# Patient Record
Sex: Female | Born: 2000 | Race: Black or African American | Hispanic: No | Marital: Single | Smoking: Never smoker
Health system: Southern US, Community
[De-identification: ages and names within clinical notes are randomized; demographics above are authoritative.]

## PROBLEM LIST (undated history)

## (undated) ENCOUNTER — Ambulatory Visit (HOSPITAL_COMMUNITY): Discharge status: Absent without leave | Payer: 59

## (undated) DIAGNOSIS — J45909 Unspecified asthma, uncomplicated: Secondary | ICD-10-CM

---
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
## 2019-05-21 ENCOUNTER — Ambulatory Visit: Payer: 59 | Attending: Family

## 2019-05-21 DIAGNOSIS — Z23 Encounter for immunization: Secondary | ICD-10-CM

## 2019-05-21 NOTE — Progress Notes (Signed)
   Covid-19 Vaccination Clinic  Name:  Zaniah Titterington    MRN: [id number] DOB: 08-04-2000  05/21/2019  Ms. Odle was observed post Covid-19 immunization for 15 minutes without incident. She was provided with Vaccine Information Sheet and instruction to access the V-Safe system.   Ms. Kiser was instructed to call 911 with any severe reactions post vaccine: Marland Kitchen Difficulty breathing  . Swelling of face and throat  . A fast heartbeat  . A bad rash all over body  . Dizziness and weakness   Immunizations Administered    Name Date Dose VIS Date Route   Moderna COVID-19 Vaccine 05/21/2019 12:11 PM 0.5 mL 12/2018 Intramuscular   Manufacturer: Moderna   Lot: [id number]   NDC: 67124-580-99

## 2019-05-24 ENCOUNTER — Ambulatory Visit: Payer: 59

## 2019-06-25 ENCOUNTER — Ambulatory Visit: Payer: 59 | Attending: Family

## 2019-06-25 DIAGNOSIS — Z23 Encounter for immunization: Secondary | ICD-10-CM

## 2019-06-25 NOTE — Progress Notes (Signed)
   Covid-19 Vaccination Clinic  Name:  Ashley Petersen    MRN: [id number] DOB: [birth year]  06/25/2019  [name] was observed post Covid-19 immunization for 15 minutes without incident. She was provided with Vaccine Information Sheet and instruction to access the V-Safe system.   [name] was instructed to call 911 with any severe reactions post vaccine: Marland Kitchen Difficulty breathing  . Swelling of face and throat  . A fast heartbeat  . A bad rash all over body  . Dizziness and weakness   Immunizations Administered    Name Date Dose VIS Date Route   Moderna COVID-19 Vaccine 06/25/2019 12:34 PM 0.5 mL 12/2018 Intramuscular   Manufacturer: Gala Murdoch   Lot: [id number]   NDC: 24497-530-05

## 2019-12-27 ENCOUNTER — Emergency Department: Payer: 59

## 2019-12-27 ENCOUNTER — Encounter: Payer: Self-pay | Admitting: Emergency Medicine

## 2019-12-27 ENCOUNTER — Emergency Department
Admission: EM | Admit: 2019-12-27 | Discharge: 2019-12-27 | Disposition: A | Discharge status: Home / Self Care | Payer: 59 | Attending: Emergency Medicine | Admitting: Emergency Medicine

## 2019-12-27 ENCOUNTER — Other Ambulatory Visit: Payer: Self-pay

## 2019-12-27 DIAGNOSIS — J45909 Unspecified asthma, uncomplicated: Secondary | ICD-10-CM | POA: Insufficient documentation

## 2019-12-27 DIAGNOSIS — R0789 Other chest pain: Secondary | ICD-10-CM | POA: Diagnosis present

## 2019-12-27 LAB — CBC
HCT: 35.7 % — ABNORMAL LOW (ref 36.0–46.0)
Hemoglobin: 11.2 g/dL — ABNORMAL LOW (ref 12.0–15.0)
MCH: 21.2 pg — ABNORMAL LOW (ref 26.0–34.0)
MCHC: 31.4 g/dL (ref 30.0–36.0)
MCV: 67.6 fL — ABNORMAL LOW (ref 80.0–100.0)
Platelets: 303 10*3/uL (ref 150–400)
RBC: 5.28 MIL/uL — ABNORMAL HIGH (ref 3.87–5.11)
RDW: 17.6 % — ABNORMAL HIGH (ref 11.5–15.5)
WBC: 4.6 10*3/uL (ref 4.0–10.5)
nRBC: 0 % (ref 0.0–0.2)

## 2019-12-27 LAB — BASIC METABOLIC PANEL
Anion gap: 11 (ref 5–15)
BUN: 12 mg/dL (ref 6–20)
CO2: 24 mmol/L (ref 22–32)
Calcium: 9.5 mg/dL (ref 8.9–10.3)
Chloride: 105 mmol/L (ref 98–111)
Creatinine, Ser: 0.53 mg/dL (ref 0.44–1.00)
GFR, Estimated: >60 mL/min (ref >60)
Glucose, Bld: 89 mg/dL (ref 70–99)
Potassium: 3.6 mmol/L (ref 3.5–5.1)
Sodium: 140 mmol/L (ref 135–145)

## 2019-12-27 LAB — TROPONIN I (HIGH SENSITIVITY): Troponin I (High Sensitivity): <2 ng/L (ref <18)

## 2019-12-27 LAB — FIBRIN DERIVATIVES D-DIMER (ARMC ONLY): Fibrin derivatives D-dimer (ARMC): 234.83 ng/mL (FEU) (ref 0.00–499.00)

## 2019-12-27 LAB — POC URINE PREG, ED: Preg Test, Ur: NEGATIVE

## 2019-12-27 NOTE — Discharge Instructions (Addendum)
Your EKG, chest x-ray, and lab tests were all normal today.  Take ibuprofen as needed for pain and follow-up with your doctor next week if the symptoms have not resolved completely.

## 2019-12-27 NOTE — ED Triage Notes (Signed)
Pt to ED via POV with c/o intermittent L sided CP, intermittent SOB and intermittent dizziness. Pt also c/o intermittent heart palpitations. Pt states had covid booster on Friday. Pt A&O x4, NAD noted in triage. Pt states symptoms since Sunday.

## 2019-12-27 NOTE — ED Provider Notes (Signed)
[facility] Emergency Department Provider Note  ____________________________________________  Time seen: Approximately 1:13 PM  I have reviewed the triage vital signs and the nursing notes.   HISTORY  Chief Complaint Chest Pain and Shortness of Breath    HPI Ashley Petersen is a 19 y.o. female with a past history of asthma and anxiety who comes the ED complaining of intermittent left-sided chest pain which is nonradiating, associated with shortness of breath and dizziness and feeling of palpitations.  No aggravating or alleviating factors.  Feels sharp.  Brief episodes last for a few seconds at a time.  No diaphoresis or vomiting, not exertional.  She does endorse pain with deep breathing.  Patient is a smoker.  Denies exogenous hormone use.  Denies history of DVT or PE.  No recent history of travel, hospitalization or surgery.  Symptoms have been ongoing for the past 5 days.  Patient had Covid booster vaccine 7 days ago.      Past Medical History:  Diagnosis Date  . Asthma      There are no problems to display for this patient.    History reviewed. No pertinent surgical history.   Prior to Admission medications   Not on File     Allergies Patient has no known allergies.   History reviewed. No pertinent family history.  Social History Social History   Tobacco Use  . Smoking status: Never Smoker  . Smokeless tobacco: Never Used  Vaping Use  . Vaping Use: Never used  Substance Use Topics  . Alcohol use: Never  . Drug use: Never    Review of Systems  Constitutional:   No fever or chills.  ENT:   No sore throat. No rhinorrhea. Cardiovascular: Positive chest pain as above without syncope. Respiratory:   No dyspnea or cough. Gastrointestinal:   Negative for abdominal pain, vomiting and diarrhea.  Musculoskeletal:   Negative for focal pain or swelling All other systems reviewed and are negative except as documented above in ROS and  HPI.  ____________________________________________   PHYSICAL EXAM:  VITAL SIGNS: ED Triage Vitals  Enc Vitals Group     BP 12/27/19 1023 126/73     Pulse Rate 12/27/19 1023 86     Resp 12/27/19 1023 17     Temp 12/27/19 1023 98.7 F (37.1 C)     Temp Source 12/27/19 1023 Oral     SpO2 12/27/19 1023 100 %     Weight 12/27/19 1025 195 lb (88.5 kg)     Height 12/27/19 1025 5\' 5"  (1.651 m)     Head Circumference --      Peak Flow --      Pain Score 12/27/19 1031 5     Pain Loc --      Pain Edu? --      Excl. in GC? --     Vital signs reviewed, nursing assessments reviewed.   Constitutional:   Alert and oriented. Non-toxic appearance. Eyes:   Conjunctivae are normal. EOMI. PERRL. ENT      Head:   Normocephalic and atraumatic.      Nose:   Wearing a mask.      Mouth/Throat:   Wearing a mask.      Neck:   No meningismus. Full ROM. Hematological/Lymphatic/Immunilogical:   No cervical lymphadenopathy. Cardiovascular:   RRR. Symmetric bilateral radial and DP pulses.  No murmurs. Cap refill less than 2 seconds. Respiratory:   Normal respiratory effort without tachypnea/retractions. Breath sounds are clear and  equal bilaterally. No wheezes/rales/rhonchi. Gastrointestinal:   Soft and nontender. Non distended. There is no CVA tenderness.  No rebound, rigidity, or guarding. Musculoskeletal:   Normal range of motion in all extremities. No joint effusions.  No lower extremity tenderness.  No edema.  Chest wall nontender to the touch as demonstrated by patient Neurologic:   Normal speech and language.  Motor grossly intact. No acute focal neurologic deficits are appreciated.  Skin:    Skin is warm, dry and intact. No rash noted.  No petechiae, purpura, or bullae.  ____________________________________________    LABS (pertinent positives/negatives) (all labs ordered are listed, but only abnormal results are displayed) Labs Reviewed  CBC - Abnormal; Notable for the following  components:      Result Value   RBC 5.28 (*)    Hemoglobin 11.2 (*)    HCT 35.7 (*)    MCV 67.6 (*)    MCH 21.2 (*)    RDW 17.6 (*)    All other components within normal limits  BASIC METABOLIC PANEL  FIBRIN DERIVATIVES D-DIMER ([facility] ONLY)  POC URINE PREG, ED  TROPONIN I (HIGH SENSITIVITY)   ____________________________________________   EKG  Interpreted by me  Date: 12/27/2019  Rate: 82  Rhythm: normal sinus rhythm  QRS Axis: normal  Intervals: normal  ST/T Wave abnormalities: normal  Conduction Disutrbances: none  Narrative Interpretation: unremarkable      ____________________________________________    RADIOLOGY  DG Chest 2 View  Result Date: 12/27/2019 CLINICAL DATA:  Heart fluttering.  Unable to catch breath. EXAM: CHEST - 2 VIEW COMPARISON:  None. FINDINGS: The heart size and mediastinal contours are within normal limits. Both lungs are clear. The visualized skeletal structures are unremarkable. IMPRESSION: Negative two view chest x-ray Electronically Signed   By: Marin Roberts M.D.   On: 12/27/2019 11:27    ____________________________________________   PROCEDURES Procedures  ____________________________________________  DIFFERENTIAL DIAGNOSIS   Vaccine inflammatory side effect, anxiety, GERD, symptomatic palpitations, PE  CLINICAL IMPRESSION / ASSESSMENT AND PLAN / ED COURSE  Medications ordered in the ED: Medications - No data to display  Pertinent labs & imaging results that were available during my care of the patient were reviewed by me and considered in my medical decision making (see chart for details).  Ashley Petersen was evaluated in Emergency Department on 12/27/2019 for the symptoms described in the history of present illness. She was evaluated in the context of the global COVID-19 pandemic, which necessitated consideration that the patient might be at risk for infection with the SARS-CoV-2 virus that causes COVID-19.  Institutional protocols and algorithms that pertain to the evaluation of patients at risk for COVID-19 are in a state of rapid change based on information released by regulatory bodies including the CDC and federal and state organizations. These policies and algorithms were followed during the patient's care in the ED.   Patient presents with atypical chest discomfort, intermittent episodes.  Due to pleuritic nature, smoking, prolonged symptoms over the past 5 days in the setting of recent exam, will obtain D-dimer to risk stratify for PE.  Given normal vital signs and overall low risk history, and chance of PE is low. Considering the patient's symptoms, medical history, and physical examination today, I have low suspicion for ACS, PE, TAD, pneumothorax, carditis, mediastinitis, pneumonia, CHF, or sepsis.  Rest of the lab panel is normal, EKG normal, chest x-ray normal, exam normal.  Pending D-dimer, patient suitable for conservative therapy with NSAIDs and outpatient follow-up.   -----------------------------------------  3:30 PM on 12/27/2019 -----------------------------------------  D-dimer normal, vital signs remain normal, stable for discharge     ____________________________________________   FINAL CLINICAL IMPRESSION(S) / ED DIAGNOSES    Final diagnoses:  Atypical chest pain     ED Discharge Orders    None      Portions of this note were generated with dragon dictation software. Dictation errors may occur despite best attempts at proofreading.   Sharman Cheek, MD 12/27/19 1530

## 2019-12-27 NOTE — ED Notes (Signed)
PT reports since last Sunday she has been having pressure to left side of chest, when pain occurs pt feels heart fluttering and gets dizzy. Pt in NAD at this time. Able to walk from lobby to room without any distress.

## 2020-09-17 ENCOUNTER — Ambulatory Visit
Admission: EM | Admit: 2020-09-17 | Discharge: 2020-09-17 | Disposition: A | Discharge status: Home / Self Care | Payer: 59 | Attending: Emergency Medicine | Admitting: Emergency Medicine

## 2020-09-17 ENCOUNTER — Other Ambulatory Visit: Payer: Self-pay

## 2020-09-17 ENCOUNTER — Encounter: Payer: Self-pay | Admitting: Emergency Medicine

## 2020-09-17 DIAGNOSIS — R030 Elevated blood-pressure reading, without diagnosis of hypertension: Secondary | ICD-10-CM | POA: Diagnosis not present

## 2020-09-17 DIAGNOSIS — B370 Candidal stomatitis: Secondary | ICD-10-CM

## 2020-09-17 LAB — POCT RAPID STREP A (OFFICE): Rapid Strep A Screen: NEGATIVE

## 2020-09-17 MED ORDER — NYSTATIN 100000 UNIT/ML MT SUSP: 500000.0000 [IU] | Freq: Four times a day (QID) | OROMUCOSAL | 0 refills | Status: AC

## 2020-09-17 NOTE — ED Triage Notes (Signed)
Pt here with sore throat persisting x 1 month. Has gotten 2 covid and strep tests with negative results. The last few days her tongue has gotten very painful and has white film on it. Throat is red and tonsils inflamed.

## 2020-09-17 NOTE — ED Provider Notes (Signed)
Ashley Petersen    CSN: [id number] Arrival date & time: 09/17/20  1520      History   Chief Complaint Chief Complaint  Patient presents with   Sore Throat     HPI Ashley Petersen is a 20 y.o. female.  Patient presents with 1 month history of sore throat.  She states it is getting worse and she is now having mouth pain also.  She denies fever, chills, rash, or other symptoms.  Patient was seen at another urgent care on 08/30/2020; diagnosed with pharyngitis, ear pain, cough; treated with methylprednisolone.  She was seen at [facility] clinic on 08/19/2020; diagnosed with sore throat; treated with cefdinir.  The history is provided by the patient and medical records.   Past Medical History:  Diagnosis Date   Asthma     There are no problems to display for this patient.   History reviewed. No pertinent surgical history.  OB History   No obstetric history on file.      Home Medications    Prior to Admission medications   Medication Sig Start Date End Date Taking? Authorizing Provider  nystatin (MYCOSTATIN) 100000 UNIT/ML suspension Take 5 mLs (500,000 Units total) by mouth 4 (four) times daily for 10 days. 09/17/20 09/27/20 Yes Mickie Bail, NP    Family History History reviewed. No pertinent family history.  Social History Social History   Tobacco Use   Smoking status: Never   Smokeless tobacco: Never  Vaping Use   Vaping Use: Never used  Substance Use Topics   Alcohol use: Never   Drug use: Never     Allergies   Patient has no known allergies.   Review of Systems Review of Systems  Constitutional:  Negative for chills and fever.  HENT:  Positive for sore throat. Negative for ear pain.   Respiratory:  Negative for cough and shortness of breath.   Cardiovascular:  Negative for chest pain and palpitations.  Gastrointestinal:  Negative for abdominal pain and vomiting.  Skin:  Negative for color change and rash.  All other systems reviewed and are  negative.   Physical Exam Triage Vital Signs ED Triage Vitals  Enc Vitals Group     BP      Pulse      Resp      Temp      Temp src      SpO2      Weight      Height      Head Circumference      Peak Flow      Pain Score      Pain Loc      Pain Edu?      Excl. in GC?    No data found.  Updated Vital Signs BP (!) 139/97 (BP Location: Left Arm)   Pulse 85   Temp 99.1 F (37.3 C) (Oral)   Resp 18   SpO2 98%   Visual Acuity Right Eye Distance:   Left Eye Distance:   Bilateral Distance:    Right Eye Near:   Left Eye Near:    Bilateral Near:     Physical Exam Vitals and nursing note reviewed.  Constitutional:      General: She is not in acute distress.    Appearance: She is well-developed. She is not ill-appearing.  HENT:     Head: Normocephalic and atraumatic.     Right Ear: Tympanic membrane normal.     Left Ear: Tympanic membrane normal.  Nose: Nose normal.     Mouth/Throat:     Mouth: Mucous membranes are moist.     Comments: White plaque on buccal mucosa and tongue.  Mild erythema of posterior pharynx. Eyes:     Conjunctiva/sclera: Conjunctivae normal.  Cardiovascular:     Rate and Rhythm: Normal rate and regular rhythm.     Heart sounds: No murmur heard. Pulmonary:     Effort: Pulmonary effort is normal. No respiratory distress.     Breath sounds: Normal breath sounds.  Abdominal:     Palpations: Abdomen is soft.     Tenderness: There is no abdominal tenderness.  Musculoskeletal:     Cervical back: Neck supple.  Skin:    General: Skin is warm and dry.  Neurological:     General: No focal deficit present.     Mental Status: She is alert and oriented to person, place, and time.     Gait: Gait normal.  Psychiatric:        Mood and Affect: Mood normal.        Behavior: Behavior normal.     UC Treatments / Results  Labs (all labs ordered are listed, but only abnormal results are displayed) Labs Reviewed  POCT RAPID STREP A (OFFICE)     EKG   Radiology No results found.  Procedures Procedures (including critical care time)  Medications Ordered in UC Medications - No data to display  Initial Impression / Assessment and Plan / UC Course  I have reviewed the triage vital signs and the nursing notes.  Pertinent labs & imaging results that were available during my care of the patient were reviewed by me and considered in my medical decision making (see chart for details).   Oral thrush. Elevated blood pressure reading.  Strep negative.  Treating thrush with nystatin oral suspension.  Education provided about thrush.  Instructed patient to follow-up with her PCP if her symptoms are not improving.  Also discussed that her blood pressure is elevated today and needs to be rechecked by her PCP in 2 to 4 weeks.  Education provided on preventing hypertension.  Patient agrees to plan of care.   Final Clinical Impressions(s) / UC Diagnoses   Final diagnoses:  Thrush  Elevated blood pressure reading     Discharge Instructions      Use the nystatin oral suspension as directed.  Follow up with your primary care provider if your symptoms are not improving.    Your blood pressure is elevated today at 139/97.  Please have this rechecked by your primary care provider in 2-4 weeks.          ED Prescriptions     Medication Sig Dispense Auth. Provider   nystatin (MYCOSTATIN) 100000 UNIT/ML suspension Take 5 mLs (500,000 Units total) by mouth 4 (four) times daily for 10 days. 200 mL Mickie Bail, NP      PDMP not reviewed this encounter.   Mickie Bail, NP 09/17/20 [phone]

## 2020-09-17 NOTE — Discharge Instructions (Addendum)
Use the nystatin oral suspension as directed.  Follow up with your primary care provider if your symptoms are not improving.    Your blood pressure is elevated today at 139/97.  Please have this rechecked by your primary care provider in 2-4 weeks.

## 2020-10-13 ENCOUNTER — Encounter: Payer: 59 | Admitting: Family Medicine

## 2020-11-06 NOTE — Progress Notes (Signed)
  Subjective:    Ashley Petersen - 20 y.o. female MRN [id number]  Date of birth: 2000/08/11  HPI  Ashley Petersen is to establish care.   Current issues and/or concerns: None.    ROS per HPI   Health Maintenance: Health Maintenance Due  Topic Date Due   HPV VACCINES (1 - 2-dose series) Never done   HIV Screening  Never done   Hepatitis C Screening  Never done   TETANUS/TDAP  Never done   COVID-19 Vaccine (3 - Booster for Moderna series) 08/20/2019   PAP-Cervical Cytology Screening  Never done   PAP SMEAR-Modifier  Never done    Past Medical History: There are no problems to display for this patient.   Social History   reports that she has never smoked. She has never used smokeless tobacco. She reports that she does not drink alcohol and does not use drugs.   Family History  family history includes Diabetes in her mother; Hypertension in her father.   Medications: reviewed and updated   Objective:   Physical Exam BP 128/85   Pulse 77   Resp 12   Wt 181 lb (82.1 kg)   LMP 10/23/2020   SpO2 97%   BMI 30.12 kg/m   Physical Exam HENT:     Head: Normocephalic and atraumatic.  Eyes:     Extraocular Movements: Extraocular movements intact.     Conjunctiva/sclera: Conjunctivae normal.     Pupils: Pupils are equal, round, and reactive to light.  Cardiovascular:     Rate and Rhythm: Normal rate and regular rhythm.     Pulses: Normal pulses.     Heart sounds: Normal heart sounds.  Pulmonary:     Effort: Pulmonary effort is normal.     Breath sounds: Normal breath sounds.  Musculoskeletal:     Cervical back: Normal range of motion and neck supple.  Neurological:     General: No focal deficit present.     Mental Status: She is alert and oriented to person, place, and time.  Psychiatric:        Mood and Affect: Mood normal.        Behavior: Behavior normal.       Assessment & Plan:  1. Encounter to establish care: - Patient presents today to establish care.  -  Return for annual physical examination, labs, and health maintenance. Arrive fasting meaning having no food for at least 8 hours prior to appointment. You may have only water or black coffee. Please take scheduled medications as normal.  2. Influenza vaccine administered: - Administered today in office. - Flu Vaccine QUAD 6+ mos PF IM (Fluarix Quad PF)    Patient was given clear instructions to go to Emergency Department or return to medical center if symptoms don't improve, worsen, or new problems develop.The patient verbalized understanding.  I discussed the assessment and treatment plan with the patient. The patient was provided an opportunity to ask questions and all were answered. The patient agreed with the plan and demonstrated an understanding of the instructions.   The patient was advised to call back or seek an in-person evaluation if the symptoms worsen or if the condition fails to improve as anticipated.    Ricky Stabs, NP 11/11/2020, 12:08 PM Primary Care at [facility]

## 2020-11-11 ENCOUNTER — Encounter: Payer: Self-pay | Admitting: Family

## 2020-11-11 ENCOUNTER — Other Ambulatory Visit: Payer: Self-pay

## 2020-11-11 ENCOUNTER — Ambulatory Visit: Payer: 59 | Admitting: Family

## 2020-11-11 VITALS — BP 128/85 | HR 77 | Resp 12 | Wt 181.0 lb

## 2020-11-11 DIAGNOSIS — Z23 Encounter for immunization: Secondary | ICD-10-CM | POA: Diagnosis not present

## 2020-11-11 DIAGNOSIS — Z7689 Persons encountering health services in other specified circumstances: Secondary | ICD-10-CM | POA: Diagnosis not present

## 2020-11-11 NOTE — Patient Instructions (Addendum)
Thank you for choosing Primary Care at [facility] for your medical home!    Ashley Petersen was seen by Rema Fendt, NP today.   Ashley Petersen's primary care provider is Ricky Stabs, NP.   For the best care possible,  you should try to see Ricky Stabs, NP whenever you come to clinic.   We look forward to seeing you again soon!  If you have any questions about your visit today,  please call us at [phone]  Or feel free to reach your provider via MyChart.    Keeping you healthy   Get these tests Blood pressure- Have your blood pressure checked once a year by your healthcare provider.  Normal blood pressure is 120/80. Weight- Have your body mass index (BMI) calculated to screen for obesity.  BMI is a measure of body fat based on height and weight. You can also calculate your own BMI at https://www.west-esparza.com/. Cholesterol- Have your cholesterol checked regularly starting at age 51, sooner may be necessary if you have diabetes, high blood pressure, if a family member developed heart diseases at an early age or if you smoke.  Chlamydia, HIV, and other sexual transmitted disease- Get screened each year until the age of 106 then within three months of each new sexual partner. Diabetes- Have your blood sugar checked regularly if you have high blood pressure, high cholesterol, a family history of diabetes or if you are overweight.   Get these vaccines Flu shot- Every fall. Tetanus shot- Every 10 years. Menactra- Single dose; prevents meningitis.   Take these steps Don't smoke- If you do smoke, ask your healthcare provider about quitting. For tips on how to quit, go to www.smokefree.gov or call 1-800-QUIT-NOW. Be physically active- Exercise 5 days a week for at least 30 minutes.  If you are not already physically active start slow and gradually work up to 30 minutes of moderate physical activity.  Examples of moderate activity include walking briskly, mowing the yard, dancing,  swimming bicycling, etc. Eat a healthy diet- Eat a variety of healthy foods such as fruits, vegetables, low fat milk, low fat cheese, yogurt, lean meats, poultry, fish, beans, tofu, etc.  For more information on healthy eating, go to www.thenutritionsource.org Drink alcohol in moderation- Limit alcohol intake two drinks or less a day.  Never drink and drive. Dentist- Brush and floss teeth twice daily; visit your dentis twice a year. Depression-Your emotional health is as important as your physical health.  If you're feeling down, losing interest in things you normally enjoy please talk with your healthcare provider. Gun Safety- If you keep a gun in your home, keep it unloaded and with the safety lock on.  Bullets should be stored separately. Helmet use- Always wear a helmet when riding a motorcycle, bicycle, rollerblading or skateboarding. Safe sex- If you may be exposed to a sexually transmitted infection, use a condom Seat belts- Seat bels can save your life; always wear one. Smoke/Carbon Monoxide detectors- These detectors need to be installed on the appropriate level of your home.  Replace batteries at least once a year. Skin Cancer- When out in the sun, cover up and use sunscreen SPF 15 or higher. Violence- If anyone is threatening or hurting you, please tell your healthcare provider.

## 2020-12-27 NOTE — Progress Notes (Signed)
Patient ID: Ashley Petersen, female    DOB: [birth year]  MRN: [id number]  CC: Annual Physical Exam  Subjective: Ashley Petersen is a 20 y.o. female who presents for annual physical exam.   Her concerns today include:  Requesting refills of asthma inhalers. Triggers tend to be thick smoke or when feeling unwell. Requesting refills of Naproxen for menstrual cramps.   Patient Active Problem List   Diagnosis Date Noted   Asthma 12/30/2020   H/O renal calculi 12/18/2013     Current Outpatient Medications on File Prior to Visit  Medication Sig Dispense Refill   EPINEPHrine (EPIPEN 2-PAK) 0.3 mg/0.3 mL IJ SOAJ injection Inject 0.3 mg into the muscle as needed for anaphylaxis.     No current facility-administered medications on file prior to visit.    Allergies  Allergen Reactions   Pecan Nut (Diagnostic) Hives, Itching, Shortness Of Breath and Swelling   Tree Extract Anaphylaxis    From an allergy test From an allergy test    Peanut-Containing Drug Products Swelling and Other (See Comments)    Social History   Socioeconomic History   Marital status: Single    Spouse name: Not on file   Number of children: Not on file   Years of education: Not on file   Highest education level: Not on file  Occupational History   Not on file  Tobacco Use   Smoking status: Never   Smokeless tobacco: Never  Vaping Use   Vaping Use: Never used  Substance and Sexual Activity   Alcohol use: Never   Drug use: Never   Sexual activity: Not on file  Other Topics Concern   Not on file  Social History Narrative   Not on file   Social Determinants of Health   Financial Resource Strain: Not on file  Food Insecurity: Not on file  Transportation Needs: Not on file  Physical Activity: Not on file  Stress: Not on file  Social Connections: Not on file  Intimate Partner Violence: Not on file    Family History  Problem Relation Age of Onset   Diabetes Mother    Hypertension Father     No  past surgical history on file.  ROS: Review of Systems Negative except as stated above  PHYSICAL EXAM: BP 113/76 (BP Location: Left Arm, Patient Position: Sitting, Cuff Size: Normal)   Pulse 70   Temp 98.3 F (36.8 C)   Resp 18   Ht 5' 5.75" (1.67 m)   Wt 184 lb 6.4 oz (83.6 kg)   SpO2 98%   BMI 29.99 kg/m   Physical Exam Exam conducted with a chaperone present.  HENT:     Head: Normocephalic and atraumatic.     Right Ear: Tympanic membrane, ear canal and external ear normal.     Left Ear: Tympanic membrane, ear canal and external ear normal.  Eyes:     Extraocular Movements: Extraocular movements intact.     Conjunctiva/sclera: Conjunctivae normal.     Pupils: Pupils are equal, round, and reactive to light.  Cardiovascular:     Rate and Rhythm: Normal rate and regular rhythm.     Pulses: Normal pulses.     Heart sounds: Normal heart sounds.  Pulmonary:     Effort: Pulmonary effort is normal.     Breath sounds: Normal breath sounds.  Chest:  Breasts:    Right: Normal.     Left: Normal.     Comments: Elmon Else, CMA present during exam. Abdominal:  General: Bowel sounds are normal.     Palpations: Abdomen is soft.  Genitourinary:    General: Normal vulva.     Vagina: Normal.     Cervix: Normal.     Uterus: Normal.      Adnexa: Right adnexa normal and left adnexa normal.     Comments: Elmon Else, CMA present during exam.  Musculoskeletal:        General: Normal range of motion.     Cervical back: Normal range of motion and neck supple.  Skin:    General: Skin is warm.     Capillary Refill: Capillary refill takes less than 2 seconds.  Neurological:     General: No focal deficit present.     Mental Status: She is alert and oriented to person, place, and time.  Psychiatric:        Mood and Affect: Mood normal.        Behavior: Behavior normal.    ASSESSMENT AND PLAN: 1. Annual physical exam: - Counseled on 150 minutes of exercise per week as  tolerated, healthy eating (including decreased daily intake of saturated fats, cholesterol, added sugars, sodium), STI prevention, and routine healthcare maintenance.  2. Screening for metabolic disorder: - YKD98+PJAS to check kidney function, liver function, and electrolyte balance.  - CMP14+EGFR  3. Screening for deficiency anemia: - CBC to screen for anemia. - CBC  4. Diabetes mellitus screening: - Hemoglobin A1c to screen for pre-diabetes/diabetes. - Hemoglobin A1c  5. Screening cholesterol level: - Lipid panel to screen for high cholesterol.  - Lipid panel  6. Thyroid disorder screen: - TSH to check thyroid function.  - TSH  7. Need for hepatitis C screening test: - Hepatitis C antibody to screen for hepatitis C.  - Hepatitis C Antibody  8. Pap smear for cervical cancer screening: - Cytology - PAP for cervical cancer screening.  - Cytology - PAP([place])  9. Routine screening for STI (sexually transmitted infection): - Cervicovaginal self-swab to screen for chlamydia, gonorrhea, trichomonas, bacterial vaginitis, and candida vaginitis. - Cervicovaginal ancillary only  10. Encounter for screening for HIV: - HIV antibody to screen for human immunodeficiency virus.  - HIV antibody (with reflex)  11. Moderate persistent asthma without complication: - Continue Albuterol inhaler and Beclomethasone inhaler as prescribed.  - Follow-up with primary provider as scheduled. - albuterol (VENTOLIN HFA) 108 (90 Base) MCG/ACT inhaler; Inhale 1-2 puffs into the lungs every 6 (six) hours as needed for wheezing or shortness of breath.  Dispense: 18 g; Refill: 1 - beclomethasone (QVAR) 40 MCG/ACT inhaler; Inhale 2 puffs into the lungs 2 (two) times daily.  Dispense: 1 each; Refill: 2  12. Menstrual cramps: - Continue Naproxen as prescribed.  - Follow-up with primary provider as scheduled.  - naproxen (EC NAPROSYN) 500 MG EC tablet; Take 1 tablet (500 mg total) by mouth 2 (two)  times daily with a meal.  Dispense: 30 tablet; Refill: 1  13. Need for Tdap vaccination: - Administered today in office.  - Tdap vaccine greater than or equal to 7yo IM  14. Need for HPV vaccination: - Administered today in office.  - HPV 9-valent vaccine,Recombinat   Patient was given the opportunity to ask questions.  Patient verbalized understanding of the plan and was able to repeat key elements of the plan. Patient was given clear instructions to go to Emergency Department or return to medical center if symptoms don't improve, worsen, or new problems develop.The patient verbalized understanding.   Orders Placed  This Encounter  Procedures   Tdap vaccine greater than or equal to 7yo IM   HPV 9-valent vaccine,Recombinat   HIV antibody (with reflex)   Hepatitis C Antibody   CBC   Lipid panel   TSH   CMP14+EGFR   Hemoglobin A1c     Requested Prescriptions   Signed Prescriptions Disp Refills   albuterol (VENTOLIN HFA) 108 (90 Base) MCG/ACT inhaler 18 g 1    Sig: Inhale 1-2 puffs into the lungs every 6 (six) hours as needed for wheezing or shortness of breath.   beclomethasone (QVAR) 40 MCG/ACT inhaler 1 each 2    Sig: Inhale 2 puffs into the lungs 2 (two) times daily.   naproxen (EC NAPROSYN) 500 MG EC tablet 30 tablet 1    Sig: Take 1 tablet (500 mg total) by mouth 2 (two) times daily with a meal.    Return in about 1 year (around 12/30/2021) for Physical per patient preference.  Camillia Herter, NP

## 2020-12-30 ENCOUNTER — Encounter: Payer: Self-pay | Admitting: Family

## 2020-12-30 ENCOUNTER — Ambulatory Visit (INDEPENDENT_AMBULATORY_CARE_PROVIDER_SITE_OTHER): Payer: 59 | Admitting: Family

## 2020-12-30 ENCOUNTER — Other Ambulatory Visit (HOSPITAL_COMMUNITY)
Admission: RE | Admit: 2020-12-30 | Discharge: 2020-12-30 | Disposition: A | Discharge status: Home / Self Care | Payer: 59 | Source: Ambulatory Visit | Attending: Family | Admitting: Family

## 2020-12-30 ENCOUNTER — Other Ambulatory Visit: Payer: Self-pay

## 2020-12-30 VITALS — BP 113/76 | HR 70 | Temp 98.3°F | Resp 18 | Ht 65.75 in | Wt 184.4 lb

## 2020-12-30 DIAGNOSIS — Z113 Encounter for screening for infections with a predominantly sexual mode of transmission: Secondary | ICD-10-CM

## 2020-12-30 DIAGNOSIS — Z124 Encounter for screening for malignant neoplasm of cervix: Secondary | ICD-10-CM | POA: Insufficient documentation

## 2020-12-30 DIAGNOSIS — Z131 Encounter for screening for diabetes mellitus: Secondary | ICD-10-CM

## 2020-12-30 DIAGNOSIS — N946 Dysmenorrhea, unspecified: Secondary | ICD-10-CM | POA: Diagnosis not present

## 2020-12-30 DIAGNOSIS — Z0001 Encounter for general adult medical examination with abnormal findings: Secondary | ICD-10-CM | POA: Diagnosis not present

## 2020-12-30 DIAGNOSIS — Z114 Encounter for screening for human immunodeficiency virus [HIV]: Secondary | ICD-10-CM

## 2020-12-30 DIAGNOSIS — J4540 Moderate persistent asthma, uncomplicated: Secondary | ICD-10-CM | POA: Diagnosis not present

## 2020-12-30 DIAGNOSIS — Z0000 Encounter for general adult medical examination without abnormal findings: Secondary | ICD-10-CM

## 2020-12-30 DIAGNOSIS — Z1159 Encounter for screening for other viral diseases: Secondary | ICD-10-CM

## 2020-12-30 DIAGNOSIS — Z23 Encounter for immunization: Secondary | ICD-10-CM | POA: Diagnosis not present

## 2020-12-30 DIAGNOSIS — Z13220 Encounter for screening for lipoid disorders: Secondary | ICD-10-CM

## 2020-12-30 DIAGNOSIS — Z130 Encounter for screening for diseases of the blood and blood-forming organs and certain disorders involving the immune mechanism: Secondary | ICD-10-CM

## 2020-12-30 DIAGNOSIS — Z13228 Encounter for screening for other metabolic disorders: Secondary | ICD-10-CM

## 2020-12-30 DIAGNOSIS — J45909 Unspecified asthma, uncomplicated: Secondary | ICD-10-CM | POA: Insufficient documentation

## 2020-12-30 DIAGNOSIS — Z1329 Encounter for screening for other suspected endocrine disorder: Secondary | ICD-10-CM

## 2020-12-30 MED ORDER — ALBUTEROL SULFATE HFA 108 (90 BASE) MCG/ACT IN AERS: 1.0000 | INHALATION_SPRAY | Freq: Four times a day (QID) | RESPIRATORY_TRACT | 1 refills | Status: AC | PRN

## 2020-12-30 MED ORDER — BECLOMETHASONE DIPROP HFA 40 MCG/ACT IN AERB: 2.0000 | INHALATION_SPRAY | Freq: Two times a day (BID) | RESPIRATORY_TRACT | 2 refills | Status: AC

## 2020-12-30 MED ORDER — NAPROXEN 500 MG PO TBEC: 500.0000 mg | DELAYED_RELEASE_TABLET | Freq: Two times a day (BID) | ORAL | 1 refills | Status: DC

## 2020-12-30 NOTE — Progress Notes (Signed)
Pt presents for annual physical exam w/pap, pt states that she has bad cramps during menstrual cycles and has used naproxen in past that helps Needs refills on inhalers  Tdap and HPV vaccine administered

## 2020-12-30 NOTE — Patient Instructions (Signed)
Preventive Care 21-21 Years Old, Female °Preventive care refers to lifestyle choices and visits with your health care provider that can promote health and wellness. Preventive care visits are also called wellness exams. °What can I expect for my preventive care visit? °Counseling °During your preventive care visit, your health care provider may ask about your: °Medical history, including: °Past medical problems. °Family medical history. °Pregnancy history. °Current health, including: °Menstrual cycle. °Method of birth control. °Emotional well-being. °Home life and relationship well-being. °Sexual activity and sexual health. °Lifestyle, including: °Alcohol, nicotine or tobacco, and drug use. °Access to firearms. °Diet, exercise, and sleep habits. °Work and work environment. °Sunscreen use. °Safety issues such as seatbelt and bike helmet use. °Physical exam °Your health care provider may check your: °Height and weight. These may be used to calculate your BMI (body mass index). BMI is a measurement that tells if you are at a healthy weight. °Waist circumference. This measures the distance around your waistline. This measurement also tells if you are at a healthy weight and may help predict your risk of certain diseases, such as type 2 diabetes and high blood pressure. °Heart rate and blood pressure. °Body temperature. °Skin for abnormal spots. °What immunizations do I need? °Vaccines are usually given at various ages, according to a schedule. Your health care provider will recommend vaccines for you based on your age, medical history, and lifestyle or other factors, such as travel or where you work. °What tests do I need? °Screening °Your health care provider may recommend screening tests for certain conditions. This may include: °Pelvic exam and Pap test. °Lipid and cholesterol levels. °Diabetes screening. This is done by checking your blood sugar (glucose) after you have not eaten for a while (fasting). °Hepatitis B  test. °Hepatitis C test. °HIV (human immunodeficiency virus) test. °STI (sexually transmitted infection) testing, if you are at risk. °BRCA-related cancer screening. This may be done if you have a family history of breast, ovarian, tubal, or peritoneal cancers. °Talk with your health care provider about your test results, treatment options, and if necessary, the need for more tests. °Follow these instructions at home: °Eating and drinking ° °Eat a healthy diet that includes fresh fruits and vegetables, whole grains, lean protein, and low-fat dairy products. °Take vitamin and mineral supplements as recommended by your health care provider. °Do not drink alcohol if: °Your health care provider tells you not to drink. °You are pregnant, may be pregnant, or are planning to become pregnant. °If you drink alcohol: °Limit how much you have to 0-1 drink a day. °Know how much alcohol is in your drink. In the [place] one drink equals one 12 oz bottle of beer (355 mL), one 5 oz glass of wine (148 mL), or one 1½ oz glass of hard liquor (44 mL). °Lifestyle °Brush your teeth every morning and night with fluoride toothpaste. Floss one time each day. °Exercise for at least 30 minutes 5 or more days each week. °Do not use any products that contain nicotine or tobacco. These products include cigarettes, chewing tobacco, and vaping devices, such as e-cigarettes. If you need help quitting, ask your health care provider. °Do not use drugs. °If you are sexually active, practice safe sex. Use a condom or other form of protection to prevent STIs. °If you do not wish to become pregnant, use a form of birth control. If you plan to become pregnant, see your health care provider for a prepregnancy visit. °Find healthy ways to manage stress, such as: °Meditation, yoga,   or listening to music. °Journaling. °Talking to a trusted person. °Spending time with friends and family. °Minimize exposure to UV radiation to reduce your risk of skin  cancer. °Safety °Always wear your seat belt while driving or riding in a vehicle. °Do not drive: °If you have been drinking alcohol. Do not ride with someone who has been drinking. °If you have been using any mind-altering substances or drugs. °While texting. °When you are tired or distracted. °Wear a helmet and other protective equipment during sports activities. °If you have firearms in your house, make sure you follow all gun safety procedures. °Seek help if you have been physically or sexually abused. °What's next? °Go to your health care provider once a year for an annual wellness visit. °Ask your health care provider how often you should have your eyes and teeth checked. °Stay up to date on all vaccines. °This information is not intended to replace advice given to you by your health care provider. Make sure you discuss any questions you have with your health care provider. °Document Revised: 07/01/2020 Document Reviewed: 07/01/2020 °Elsevier Patient Education © 2022 [employer] Inc. ° °

## 2020-12-31 ENCOUNTER — Other Ambulatory Visit: Payer: Self-pay | Admitting: Family

## 2020-12-31 DIAGNOSIS — Z1329 Encounter for screening for other suspected endocrine disorder: Secondary | ICD-10-CM

## 2020-12-31 DIAGNOSIS — Z130 Encounter for screening for diseases of the blood and blood-forming organs and certain disorders involving the immune mechanism: Secondary | ICD-10-CM

## 2020-12-31 LAB — CBC
Hematocrit: 36.6 % (ref 34.0–46.6)
Hemoglobin: 11 g/dL — ABNORMAL LOW (ref 11.1–15.9)
MCH: 20.5 pg — ABNORMAL LOW (ref 26.6–33.0)
MCHC: 30.1 g/dL — ABNORMAL LOW (ref 31.5–35.7)
MCV: 68 fL — ABNORMAL LOW (ref 79–97)
Platelets: 325 10*3/uL (ref 150–450)
RBC: 5.36 x10E6/uL — ABNORMAL HIGH (ref 3.77–5.28)
RDW: 17.3 % — ABNORMAL HIGH (ref 11.7–15.4)
WBC: 4.2 10*3/uL (ref 3.4–10.8)

## 2020-12-31 LAB — CMP14+EGFR
ALT: 9 IU/L (ref 0–32)
AST: 13 IU/L (ref 0–40)
Albumin/Globulin Ratio: 1.8 (ref 1.2–2.2)
Albumin: 4.7 g/dL (ref 3.9–5.0)
Alkaline Phosphatase: 58 IU/L (ref 44–121)
BUN/Creatinine Ratio: 13 (ref 9–23)
BUN: 8 mg/dL (ref 6–20)
Bilirubin Total: 0.5 mg/dL (ref 0.0–1.2)
CO2: 24 mmol/L (ref 20–29)
Calcium: 9.9 mg/dL (ref 8.7–10.2)
Chloride: 100 mmol/L (ref 96–106)
Creatinine, Ser: 0.6 mg/dL (ref 0.57–1.00)
Globulin, Total: 2.6 g/dL (ref 1.5–4.5)
Glucose: 79 mg/dL (ref 70–99)
Potassium: 4.1 mmol/L (ref 3.5–5.2)
Sodium: 139 mmol/L (ref 134–144)
Total Protein: 7.3 g/dL (ref 6.0–8.5)
eGFR: 131 mL/min/{1.73_m2} (ref >59)

## 2020-12-31 LAB — HIV ANTIBODY (ROUTINE TESTING W REFLEX): HIV Screen 4th Generation wRfx: NONREACTIVE

## 2020-12-31 LAB — CERVICOVAGINAL ANCILLARY ONLY
Bacterial Vaginitis (gardnerella): POSITIVE — AB
Candida Glabrata: NEGATIVE
Candida Vaginitis: POSITIVE — AB
Chlamydia: NEGATIVE
Comment: NEGATIVE
Comment: NEGATIVE
Comment: NEGATIVE
Comment: NEGATIVE
Comment: NEGATIVE
Comment: NORMAL
Neisseria Gonorrhea: NEGATIVE
Trichomonas: NEGATIVE

## 2020-12-31 LAB — HEMOGLOBIN A1C
Est. average glucose Bld gHb Est-mCnc: 105 mg/dL
Hgb A1c MFr Bld: 5.3 % (ref 4.8–5.6)

## 2020-12-31 LAB — CYTOLOGY - PAP
Adequacy: ABSENT
Diagnosis: NEGATIVE

## 2020-12-31 LAB — LIPID PANEL
Chol/HDL Ratio: 3.7 ratio (ref 0.0–4.4)
Cholesterol, Total: 184 mg/dL (ref 100–199)
HDL: 50 mg/dL (ref >39)
LDL Chol Calc (NIH): 123 mg/dL — ABNORMAL HIGH (ref 0–99)
Triglycerides: 56 mg/dL (ref 0–149)
VLDL Cholesterol Cal: 11 mg/dL (ref 5–40)

## 2020-12-31 LAB — HEPATITIS C ANTIBODY: Hep C Virus Ab: <0.1 s/co ratio (ref 0.0–0.9)

## 2020-12-31 LAB — TSH: TSH: 0.21 u[IU]/mL — ABNORMAL LOW (ref 0.450–4.500)

## 2020-12-31 NOTE — Progress Notes (Signed)
Kidney function normal.  Liver function normal.   No diabetes.   Hepatitis C negative.  HIV negative.   Thyroid function lower than normal. Please call our office and schedule to have thyroid rechecked in 4 to 6 weeks at lab only appointment.  Adding iron panel to further screen for anemia.  LDL cholesterol (sometimes called "bad cholesterol") mildly higher than expected. High cholesterol may increase risk of heart attack and/or stroke. Consider eating more fruits, vegetables, and lean baked meats such as chicken or fish. Moderate intensity exercise at least 150 minutes as tolerated per week may help as well. No medication needed as of present. Encouraged to recheck fasting cholesterol in 3 to 6 months.

## 2021-01-01 ENCOUNTER — Other Ambulatory Visit: Payer: Self-pay | Admitting: Family

## 2021-01-01 DIAGNOSIS — N760 Acute vaginitis: Secondary | ICD-10-CM

## 2021-01-01 DIAGNOSIS — B9689 Other specified bacterial agents as the cause of diseases classified elsewhere: Secondary | ICD-10-CM | POA: Insufficient documentation

## 2021-01-01 DIAGNOSIS — B3731 Acute candidiasis of vulva and vagina: Secondary | ICD-10-CM

## 2021-01-01 MED ORDER — FLUCONAZOLE 150 MG PO TABS: 150.0000 mg | ORAL_TABLET | Freq: Once | ORAL | 0 refills | Status: AC

## 2021-01-01 MED ORDER — METRONIDAZOLE 500 MG PO TABS
500.0000 mg | ORAL_TABLET | Freq: Two times a day (BID) | ORAL | 0 refills | Status: AC
Start: 2021-01-01 — End: 2021-01-08

## 2021-01-01 NOTE — Progress Notes (Signed)
PAP smear negative for lesion or malignancy. Repeat PAP smear in 3 years or sooner if needed.   Positive for Bacterial Vaginitis, an overgrowth of normal bacteria in the vagina due to changes in pH. Prescribed Metronidazole (Flagyl) twice per day for 7 days. Do not drink alcohol while taking this medication as this may cause severe nausea, vomiting, and upset stomach.   Positive for Candida Vaginitis which is better known as a yeast infection. Prescribed Fluconazole (Diflucan) 150 mg (1 tablet) for a one time dose.

## 2021-01-03 LAB — TSH+T4F+T3FREE
Free T4: 1.15 ng/dL (ref 0.82–1.77)
T3, Free: 2.8 pg/mL (ref 2.0–4.4)
TSH: 0.237 u[IU]/mL — ABNORMAL LOW (ref 0.450–4.500)

## 2021-01-03 LAB — IRON,TIBC AND FERRITIN PANEL
Ferritin: 11 ng/mL — ABNORMAL LOW (ref 15–150)
Iron Saturation: 24 % (ref 15–55)
Iron: 95 ug/dL (ref 27–159)
Total Iron Binding Capacity: 398 ug/dL (ref 250–450)
UIBC: 303 ug/dL (ref 131–425)

## 2021-01-03 LAB — SPECIMEN STATUS REPORT

## 2021-01-04 NOTE — Progress Notes (Signed)
Iron panel with mildly lower than normal iron stores. Recommendation for over-the-counter iron supplement and to increase iron-rich foods in the diet such as green leafy vegetables.

## 2021-01-27 ENCOUNTER — Other Ambulatory Visit: Payer: 59

## 2021-01-27 ENCOUNTER — Other Ambulatory Visit: Payer: Self-pay

## 2021-01-27 DIAGNOSIS — Z130 Encounter for screening for diseases of the blood and blood-forming organs and certain disorders involving the immune mechanism: Secondary | ICD-10-CM

## 2021-01-27 DIAGNOSIS — Z1329 Encounter for screening for other suspected endocrine disorder: Secondary | ICD-10-CM

## 2021-01-27 NOTE — Addendum Note (Signed)
Addended by: Margorie John on: 01/27/2021 04:11 PM   Modules accepted: Orders

## 2021-01-28 ENCOUNTER — Other Ambulatory Visit: Payer: Self-pay | Admitting: Family

## 2021-01-28 DIAGNOSIS — E0590 Thyrotoxicosis, unspecified without thyrotoxic crisis or storm: Secondary | ICD-10-CM

## 2021-01-28 LAB — IRON,TIBC AND FERRITIN PANEL
Ferritin: 21 ng/mL (ref 15–150)
Iron Saturation: 13 % — ABNORMAL LOW (ref 15–55)
Iron: 53 ug/dL (ref 27–159)
Total Iron Binding Capacity: 398 ug/dL (ref 250–450)
UIBC: 345 ug/dL (ref 131–425)

## 2021-01-28 LAB — TSH+T4F+T3FREE
Free T4: 1.23 ng/dL (ref 0.82–1.77)
T3, Free: 3.1 pg/mL (ref 2.0–4.4)
TSH: 0.315 u[IU]/mL — ABNORMAL LOW (ref 0.450–4.500)

## 2021-01-28 NOTE — Progress Notes (Signed)
Iron panel essentially normal.   Thyroid function remaining lower than normal. Referral to Endocrinology. Their office should call within 2 weeks with appointment details.

## 2021-03-03 ENCOUNTER — Encounter: Payer: Self-pay | Admitting: Family

## 2021-03-25 ENCOUNTER — Other Ambulatory Visit: Payer: Self-pay | Admitting: Family

## 2021-03-25 DIAGNOSIS — E0590 Thyrotoxicosis, unspecified without thyrotoxic crisis or storm: Secondary | ICD-10-CM

## 2021-04-19 ENCOUNTER — Encounter: Payer: Self-pay | Admitting: Family

## 2021-04-19 ENCOUNTER — Other Ambulatory Visit: Payer: Self-pay | Admitting: Family

## 2021-04-19 DIAGNOSIS — N946 Dysmenorrhea, unspecified: Secondary | ICD-10-CM

## 2021-04-19 MED ORDER — NAPROXEN 500 MG PO TBEC: 500.0000 mg | DELAYED_RELEASE_TABLET | Freq: Two times a day (BID) | ORAL | 2 refills | Status: AC

## 2021-08-31 NOTE — Progress Notes (Unsigned)
Name: Ashley Petersen  MRN/ DOB: [id number], 2000-12-23    Age/ Sex: 21 y.o., female    PCP: Camillia Herter, NP   Reason for Endocrinology Evaluation: Subclinical Hyperthyroidism     Date of Initial Endocrinology Evaluation: 08/31/2021     HPI: [name] is a 21 y.o. female with a past medical history of Subclinical Hyperthyroidism. The patient presented for initial endocrinology clinic visit on 08/31/2021 for consultative assistance with her Subclinical Hyperthyroidism .   She has been diagnosed with subclinical hyperthyroidism in 12/2020 with a TSH nadir of 0.210 uIU/ml and normal FT4 and T3.    Weight has increased  Has occasional palpitations with rare hand tremors  Has anxiety and SOB Denies local neck swelling  No Biotin intake  No pregnancies in the past  Has excessive sweating and loose stools   No Fh of thyroid disease      HISTORY:  Past Medical History:  Past Medical History:  Diagnosis Date   Asthma    Past Surgical History: No past surgical history on file.  Social History:  reports that she has never smoked. She has never used smokeless tobacco. She reports that she does not drink alcohol and does not use drugs. Family History: family history includes Diabetes in her mother; Hypertension in her father.   HOME MEDICATIONS: Allergies as of 09/01/2021       Reactions   Pecan Nut (diagnostic) Hives, Itching, Shortness Of Breath, Swelling   Tree Extract Anaphylaxis   From an allergy test From an allergy test   Peanut-containing Drug Products Swelling, Other (See Comments)        Medication List        Accurate as of August 31, 2021 12:47 PM. If you have any questions, ask your nurse or doctor.          albuterol 108 (90 Base) MCG/ACT inhaler Commonly known as: VENTOLIN HFA Inhale 1-2 puffs into the lungs every 6 (six) hours as needed for wheezing or shortness of breath.   beclomethasone 40 MCG/ACT inhaler Commonly known as:  QVAR Inhale 2 puffs into the lungs 2 (two) times daily.   EpiPen 2-Pak 0.3 mg/0.3 mL Soaj injection Generic drug: EPINEPHrine Inject 0.3 mg into the muscle as needed for anaphylaxis.          REVIEW OF SYSTEMS: A comprehensive ROS was conducted with the patient and is negative except as per HPI     OBJECTIVE:  VS: There were no vitals taken for this visit.   Wt Readings from Last 3 Encounters:  12/30/20 184 lb 6.4 oz (83.6 kg)  11/11/20 181 lb (82.1 kg)  12/27/19 195 lb (88.5 kg)     EXAM: General: Pt appears well and is in NAD  Eyes: External eye exam normal without stare, lid lag or exophthalmos.  EOM intact.    Neck: General: Supple without adenopathy. Thyroid: Thyroid size normal.  No goiter or nodules appreciated. No thyroid bruit.  Lungs: Clear with good BS bilat with no rales, rhonchi, or wheezes  Heart: Auscultation: RRR.  Abdomen: Normoactive bowel sounds, soft, nontender, without masses or organomegaly palpable  Extremities:  BL LE: No pretibial edema normal ROM and strength.  Mental Status: Judgment, insight: Intact Orientation: Oriented to time, place, and person Mood and affect: No depression, anxiety, or agitation     DATA REVIEWED: ***    Latest Reference Range & Units 12/30/20 13:21  Potassium 3.5 - 5.2 mmol/L 4.1  Chloride 96 -  106 mmol/L 100  CO2 20 - 29 mmol/L 24  Glucose 70 - 99 mg/dL 79  BUN 6 - 20 mg/dL 8  Creatinine 0.57 - 1.00 mg/dL 0.60  Calcium 8.7 - 10.2 mg/dL 9.9  BUN/Creatinine Ratio 9 - 23  13  eGFR >59 mL/min/1.73 131  Alkaline Phosphatase 44 - 121 IU/L 58  Albumin 3.9 - 5.0 g/dL 4.7  Albumin/Globulin Ratio 1.2 - 2.2  1.8  AST 0 - 40 IU/L 13  ALT 0 - 32 IU/L 9  Total Protein 6.0 - 8.5 g/dL 7.3  Total Bilirubin 0.0 - 1.2 mg/dL 0.5    Latest Reference Range & Units 12/30/20 13:21  WBC 3.4 - 10.8 x10E3/uL 4.2  RBC 3.77 - 5.28 x10E6/uL 5.36 (H)  Hemoglobin 11.1 - 15.9 g/dL 11.0 (L)  HCT 34.0 - 46.6 % 36.6  MCV 79 - 97 fL  68 (L)  MCH 26.6 - 33.0 pg 20.5 (L)  MCHC 31.5 - 35.7 g/dL 30.1 (L)  RDW 11.7 - 15.4 % 17.3 (H)  Platelets 150 - 450 x10E3/uL 325   ASSESSMENT/PLAN/RECOMMENDATIONS:   Subclinical Hyperthyroidism:    Medications :  Signed electronically by: Mack Guise, MD  [facility]  [facility] [address] [place], [place] [address] Phone: [phone] FAX: [phone]   CC: Camillia Herter, NP [place] [address] Phone: [phone] Fax: [phone]   Return to Endocrinology clinic as below: Future Appointments  Date Time Provider [place]  09/01/2021  8:30 AM Aundra Espin, Melanie Crazier, MD [id number] None

## 2021-09-01 ENCOUNTER — Ambulatory Visit: Payer: 59 | Admitting: Internal Medicine

## 2021-09-01 ENCOUNTER — Encounter: Payer: Self-pay | Admitting: Internal Medicine

## 2021-09-01 VITALS — BP 120/82 | HR 88 | Ht 65.75 in | Wt 189.8 lb

## 2021-09-01 DIAGNOSIS — E0590 Thyrotoxicosis, unspecified without thyrotoxic crisis or storm: Secondary | ICD-10-CM

## 2021-09-01 LAB — TSH: TSH: 0.32 u[IU]/mL — ABNORMAL LOW (ref 0.35–5.50)

## 2021-09-01 LAB — T4, FREE: Free T4: 0.85 ng/dL (ref 0.60–1.60)

## 2021-09-02 MED ORDER — METHIMAZOLE 5 MG PO TABS
2.5000 mg | ORAL_TABLET | Freq: Every day | ORAL | 1 refills | Status: DC
Start: 2021-09-02 — End: 2021-12-23

## 2021-09-03 LAB — T3: T3, Total: 136 ng/dL (ref 76–181)

## 2021-09-03 LAB — TRAB (TSH RECEPTOR BINDING ANTIBODY): TRAB: 1.79 IU/L (ref <=2.00)

## 2021-10-04 ENCOUNTER — Other Ambulatory Visit (INDEPENDENT_AMBULATORY_CARE_PROVIDER_SITE_OTHER): Payer: 59

## 2021-10-04 ENCOUNTER — Other Ambulatory Visit: Payer: Self-pay | Admitting: Internal Medicine

## 2021-10-04 DIAGNOSIS — E0590 Thyrotoxicosis, unspecified without thyrotoxic crisis or storm: Secondary | ICD-10-CM

## 2021-10-04 LAB — TSH: TSH: 0.39 u[IU]/mL (ref 0.35–5.50)

## 2021-10-04 LAB — T4, FREE: Free T4: 0.74 ng/dL (ref 0.60–1.60)

## 2021-10-11 ENCOUNTER — Ambulatory Visit
Admission: EM | Admit: 2021-10-11 | Discharge: 2021-10-11 | Disposition: A | Discharge status: Home / Self Care | Payer: 59 | Attending: Physician Assistant | Admitting: Physician Assistant

## 2021-10-11 DIAGNOSIS — Z1152 Encounter for screening for COVID-19: Secondary | ICD-10-CM | POA: Insufficient documentation

## 2021-10-11 DIAGNOSIS — J069 Acute upper respiratory infection, unspecified: Secondary | ICD-10-CM | POA: Insufficient documentation

## 2021-10-11 NOTE — ED Triage Notes (Signed)
Pt c/o cough, sore throat, nasal congestion. Reports sister is covid(+) and she has been rapid testing at home (-) but she wants to verify.

## 2021-10-11 NOTE — ED Provider Notes (Signed)
[name]    CSN: [id number] Arrival date & time: 10/11/21  1543      History   Chief Complaint Chief Complaint  Patient presents with   Cough    HPI Ashley Petersen is a 21 y.o. female.   Patient here today for evaluation of nasal congestion, sore throat and cough that started a few days ago.  She reports that sore throat has improved somewhat.  Sister tested positive for COVID however patient's COVID screening at home has been negative.  She denies fever.  She reports some nausea but no vomiting or diarrhea.  She has been taking DayQuil and NyQuil with mild relief of symptoms.  The history is provided by the patient.  Cough Associated symptoms: sore throat   Associated symptoms: no chills, no ear pain, no eye discharge, no fever, no shortness of breath and no wheezing     Past Medical History:  Diagnosis Date   Asthma     Patient Active Problem List   Diagnosis Date Noted   Bacterial vaginitis 01/01/2021   Candida vaginitis 01/01/2021   Asthma 12/30/2020   H/O renal calculi 12/18/2013    History reviewed. No pertinent surgical history.  OB History   No obstetric history on file.      Home Medications    Prior to Admission medications   Medication Sig Start Date End Date Taking? Authorizing Provider  albuterol (VENTOLIN HFA) 108 (90 Base) MCG/ACT inhaler Inhale 1-2 puffs into the lungs every 6 (six) hours as needed for wheezing or shortness of breath. 12/30/20   Camillia Herter, NP  beclomethasone (QVAR) 40 MCG/ACT inhaler Inhale 2 puffs into the lungs 2 (two) times daily. 12/30/20   Camillia Herter, NP  EPINEPHrine (EPIPEN 2-PAK) 0.3 mg/0.3 mL IJ SOAJ injection Inject 0.3 mg into the muscle as needed for anaphylaxis.    [provider]  methimazole (TAPAZOLE) 5 MG tablet Take 0.5 tablets (2.5 mg total) by mouth daily. 09/02/21   Shamleffer, Melanie Crazier, MD  naproxen (NAPROSYN) 500 MG tablet Take 500 mg by mouth as needed.    [provider]    Family History Family History  Problem Relation Age of Onset   Diabetes Mother    Hypertension Father     Social History Social History   Tobacco Use   Smoking status: Never   Smokeless tobacco: Never  Vaping Use   Vaping Use: Never used  Substance Use Topics   Alcohol use: Never   Drug use: Never     Allergies   Pecan nut (diagnostic), Tree extract, and Peanut-containing drug products   Review of Systems Review of Systems  Constitutional:  Negative for chills and fever.  HENT:  Positive for congestion and sore throat. Negative for ear pain.   Eyes:  Negative for discharge and redness.  Respiratory:  Positive for cough. Negative for shortness of breath and wheezing.   Gastrointestinal:  Negative for abdominal pain, diarrhea, nausea and vomiting.     Physical Exam Triage Vital Signs ED Triage Vitals [10/11/21 1548]  Enc Vitals Group     BP 123/72     Pulse Rate 83     Resp 16     Temp 98.4 F (36.9 C)     Temp Source Oral     SpO2 98 %     Weight      Height      Head Circumference      Peak Flow  Pain Score 6     Pain Loc      Pain Edu?      Excl. in [place]?    No data found.  Updated Vital Signs BP 123/72 (BP Location: Left Arm)   Pulse 83   Temp 98.4 F (36.9 C) (Oral)   Resp 16   SpO2 98%   Physical Exam Vitals and nursing note reviewed.  Constitutional:      General: She is not in acute distress.    Appearance: Normal appearance. She is not ill-appearing.  HENT:     Head: Normocephalic and atraumatic.     Nose: Congestion present.     Mouth/Throat:     Mouth: Mucous membranes are moist.     Pharynx: No oropharyngeal exudate or posterior oropharyngeal erythema.  Eyes:     Conjunctiva/sclera: Conjunctivae normal.  Cardiovascular:     Rate and Rhythm: Normal rate and regular rhythm.     Heart sounds: Normal heart sounds. No murmur heard. Pulmonary:     Effort: Pulmonary effort is normal. No respiratory distress.      Breath sounds: Normal breath sounds. No wheezing, rhonchi or rales.  Skin:    General: Skin is warm and dry.  Neurological:     Mental Status: She is alert.  Psychiatric:        Mood and Affect: Mood normal.        Thought Content: Thought content normal.      UC Treatments / Results  Labs (all labs ordered are listed, but only abnormal results are displayed) Labs Reviewed  SARS CORONAVIRUS 2 (TAT 6-24 HRS)    EKG   Radiology No results found.  Procedures Procedures (including critical care time)  Medications Ordered in UC Medications - No data to display  Initial Impression / Assessment and Plan / UC Course  I have reviewed the triage vital signs and the nursing notes.  Pertinent labs & imaging results that were available during my care of the patient were reviewed by me and considered in my medical decision making (see chart for details).   Suspect likely viral etiology of symptoms.  Will order COVID screening given known exposure.  Encouraged symptomatic treatment and follow-up with any further concerns.   Final Clinical Impressions(s) / UC Diagnoses   Final diagnoses:  Acute upper respiratory infection  Encounter for screening for COVID-19   Discharge Instructions   None    ED Prescriptions   None    PDMP not reviewed this encounter.   Francene Finders, PA-C 10/11/21 1640

## 2021-10-12 LAB — SARS CORONAVIRUS 2 (TAT 6-24 HRS): SARS Coronavirus 2: NEGATIVE

## 2021-11-03 IMAGING — CR DG CHEST 2V
1 series · 2 of 2 positions shown · non-contrast
Comparison: None.

CLINICAL DATA: Heart fluttering.  Unable to catch breath.

EXAM:
CHEST - 2 VIEW

[Series 1: dg chest 2 view · 0.14mm/px · 2 of 2 slices shown]
[im 1/2]
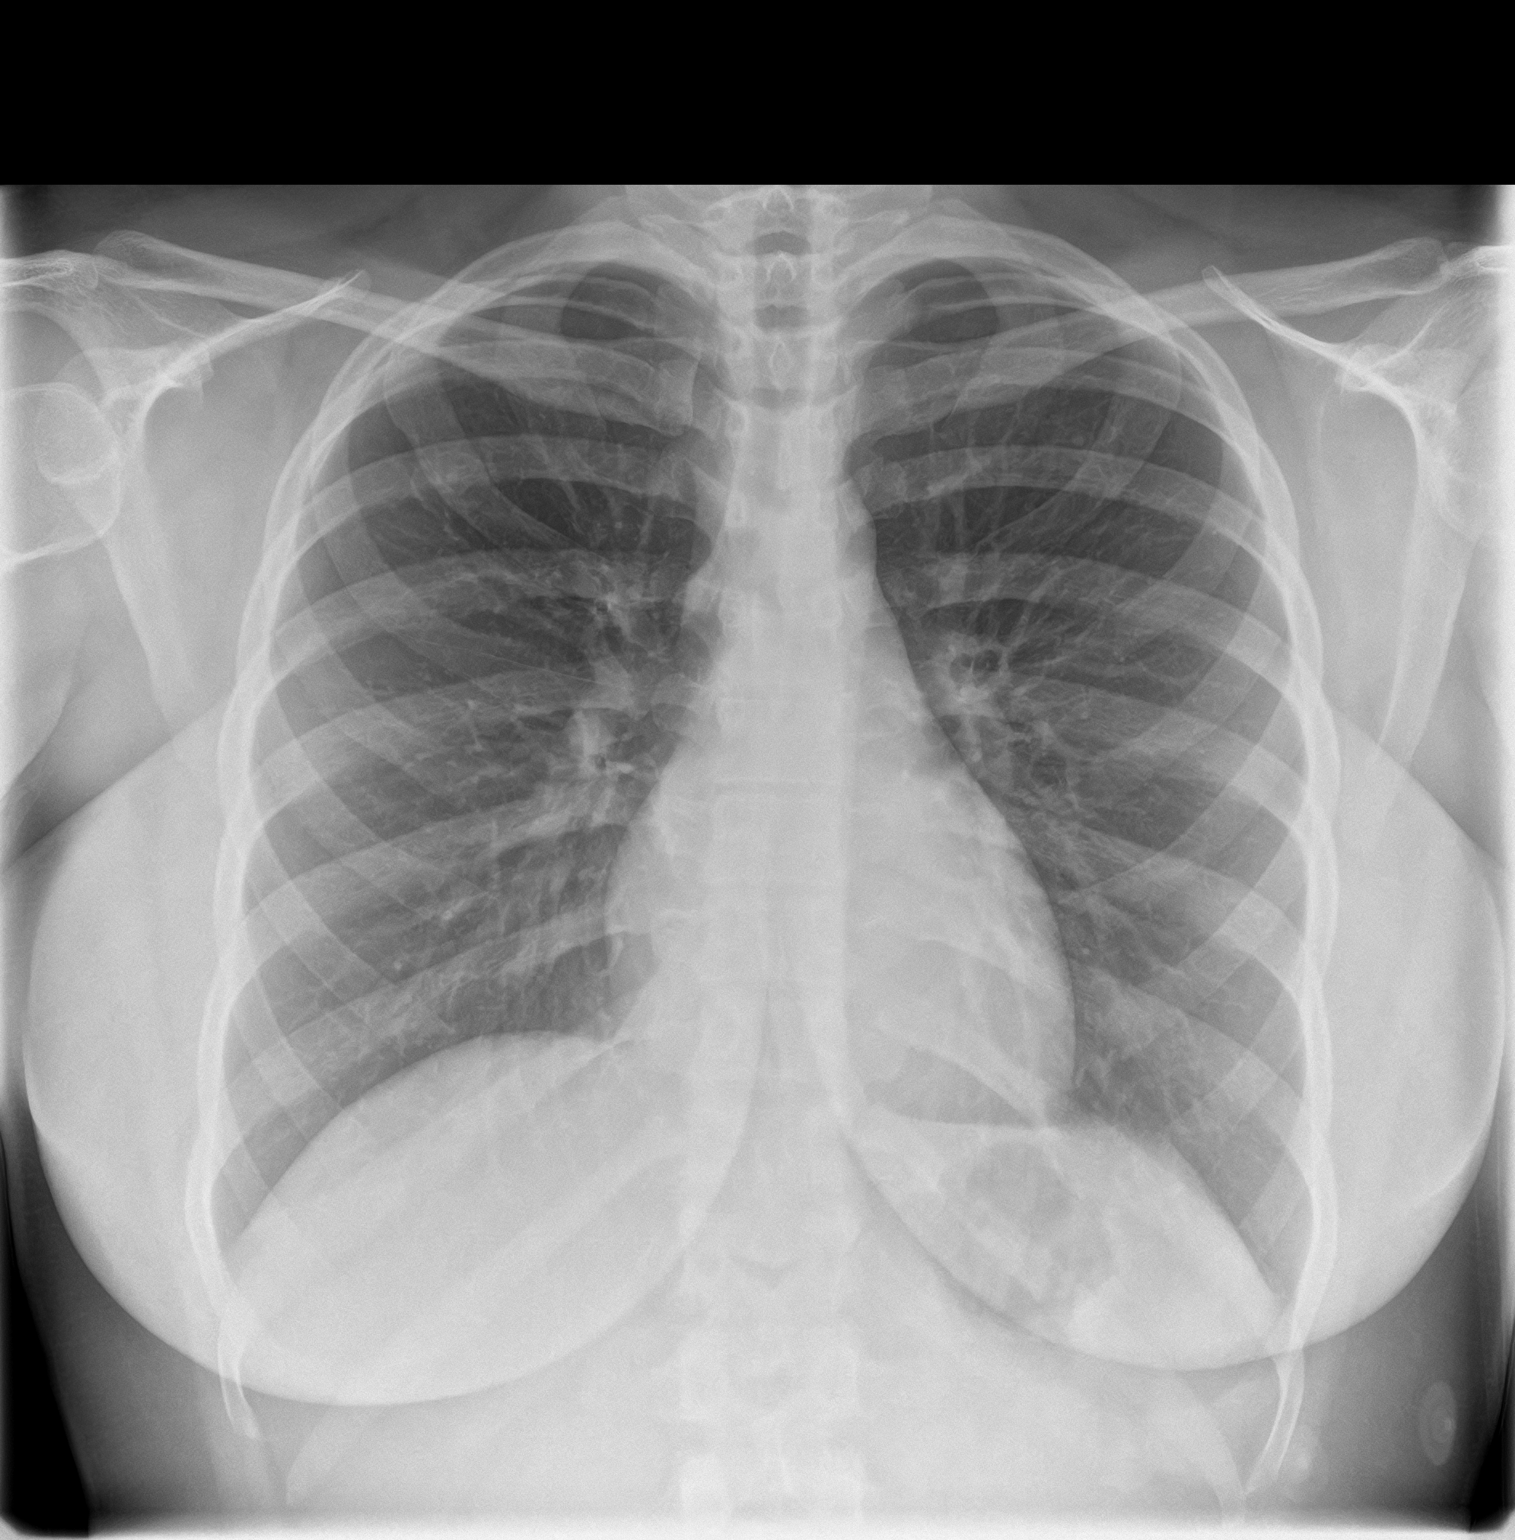
[im 2/2]
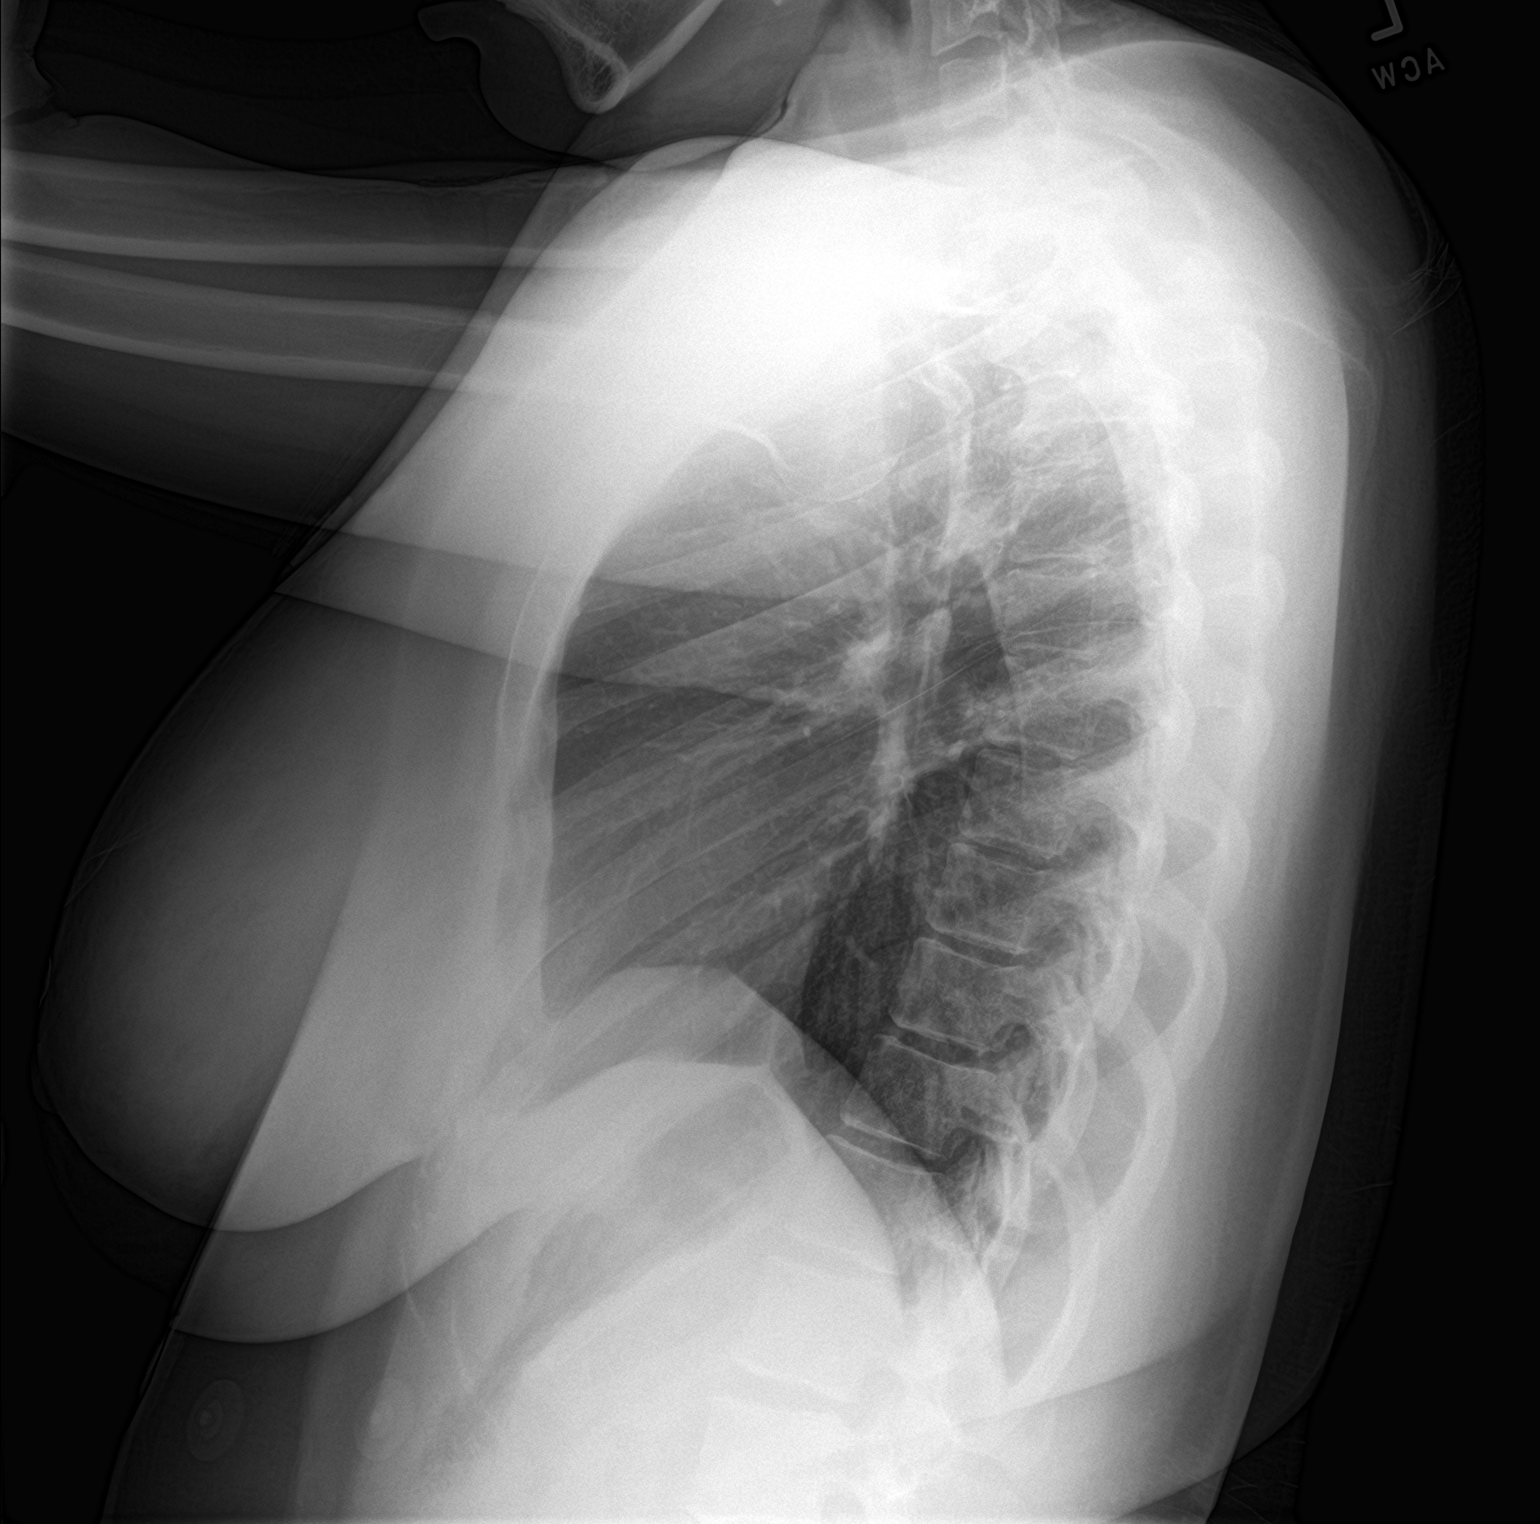

[2 of 2 positions shown; findings below may reference images not displayed]

FINDINGS: The heart size and mediastinal contours are within normal limits.
Both lungs are clear. The visualized skeletal structures are
unremarkable.
IMPRESSION: Negative two view chest x-ray

## 2021-11-23 ENCOUNTER — Other Ambulatory Visit: Payer: Self-pay | Admitting: Family

## 2021-12-14 ENCOUNTER — Other Ambulatory Visit: Payer: Self-pay | Admitting: Family

## 2021-12-14 DIAGNOSIS — N946 Dysmenorrhea, unspecified: Secondary | ICD-10-CM

## 2021-12-14 DIAGNOSIS — Z87892 Personal history of anaphylaxis: Secondary | ICD-10-CM

## 2021-12-15 ENCOUNTER — Other Ambulatory Visit: Payer: Self-pay

## 2021-12-15 MED ORDER — EPINEPHRINE 0.3 MG/0.3ML IJ SOAJ
0.3000 mg | INTRAMUSCULAR | 1 refills | Status: AC | PRN
  Filled 2021-12-15: qty 2, 30d supply, fill #0

## 2021-12-15 MED ORDER — NAPROXEN 500 MG PO TABS
500.0000 mg | ORAL_TABLET | Freq: Every day | ORAL | 2 refills | Status: DC | PRN
  Filled 2021-12-15: qty 30, 30d supply, fill #0
  Filled 2022-05-17 – 2022-06-02 (×2): qty 30, 30d supply, fill #1
  Filled 2022-10-19: qty 30, 30d supply, fill #2

## 2021-12-15 NOTE — Telephone Encounter (Signed)
Order complete. 

## 2021-12-23 ENCOUNTER — Encounter: Payer: Self-pay | Admitting: Family

## 2021-12-23 ENCOUNTER — Ambulatory Visit: Payer: 59 | Admitting: Family

## 2021-12-23 ENCOUNTER — Other Ambulatory Visit: Payer: Self-pay

## 2021-12-23 ENCOUNTER — Encounter: Payer: Self-pay | Admitting: Internal Medicine

## 2021-12-23 VITALS — BP 114/75 | HR 87 | Temp 98.3°F | Resp 16 | Ht 65.75 in | Wt 188.0 lb

## 2021-12-23 DIAGNOSIS — J029 Acute pharyngitis, unspecified: Secondary | ICD-10-CM

## 2021-12-23 DIAGNOSIS — J039 Acute tonsillitis, unspecified: Secondary | ICD-10-CM | POA: Diagnosis not present

## 2021-12-23 DIAGNOSIS — J351 Hypertrophy of tonsils: Secondary | ICD-10-CM

## 2021-12-23 LAB — POCT RAPID STREP A (OFFICE): Rapid Strep A Screen: NEGATIVE

## 2021-12-23 MED ORDER — METHIMAZOLE 5 MG PO TABS
2.5000 mg | ORAL_TABLET | Freq: Every day | ORAL | 1 refills | Status: DC
  Filled 2021-12-23: qty 15, 30d supply, fill #0

## 2021-12-23 MED ORDER — AMOXICILLIN 500 MG PO CAPS: 1000.0000 mg | ORAL_CAPSULE | Freq: Every day | ORAL | 0 refills | Status: AC

## 2021-12-23 NOTE — Progress Notes (Signed)
Pt presents for sore throat -started Monday  -white spots appeared yesterday

## 2021-12-23 NOTE — Progress Notes (Addendum)
Patient ID: Ashley Petersen, female    DOB: 30-Nov-2000  MRN: [id number]  CC: Sore Throat  Subjective: Ashley Petersen is a 21 y.o. female who presents for sore throat.   Her concerns today include:  Patient message 12/23/2021: Good morning Dr. Zonia Kief Monday I noticed sores on my gums/lip and Tuesday I began to have a sore throat. There are visible white spots on my tonsils and they are swollen/red. This happened to me also back in September of 2022 but my tests came back negative for covid strep & mono. The urgent care providers couldn't figure out what was going on. Could this be tonsillitis?    I'm having to miss work today because my job told me I could be contagious.   I wanted to reach out to you first because I don't want to go to multiple urgent cares like last time just for me to not really be diagnosed/treated.     Today's visit 12/23/2021: Discussed in detail with patient. Today's report is same as patient message above. Denies red flag symptoms. Mouth sores resolved. 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    Patient Active Problem List   Diagnosis Date Noted   Bacterial vaginitis 01/01/2021   Candida vaginitis 01/01/2021   Asthma 12/30/2020   H/O renal calculi 12/18/2013     Current Outpatient Medications on File Prior to Visit  Medication Sig Dispense Refill   albuterol (VENTOLIN HFA) 108 (90 Base) MCG/ACT inhaler Inhale 1-2 puffs into the lungs every 6 (six) hours as needed for wheezing or shortness of breath. 18 g 1   beclomethasone (QVAR) 40 MCG/ACT inhaler Inhale 2 puffs into the lungs 2 (two) times daily. 1 each 2   EPINEPHrine (EPIPEN 2-PAK) 0.3 mg/0.3 mL IJ SOAJ injection Inject 0.3 mg into the muscle as needed for anaphylaxis. 2 each 1   methimazole (TAPAZOLE) 5 MG tablet Take 0.5 tablets (2.5 mg total) by mouth daily. 45 tablet 1   naproxen (NAPROSYN) 500 MG tablet Take 1 tablet (500 mg total) by mouth daily as  needed. 30 tablet 2   No current facility-administered medications on file prior to visit.    Allergies  Allergen Reactions   Pecan Nut (Diagnostic) Hives, Itching, Shortness Of Breath and Swelling   Tree Extract Anaphylaxis    From an allergy test From an allergy test    Peanut-Containing Drug Products Swelling and Other (See Comments)    Social History   Socioeconomic History   Marital status: Single    Spouse name: Not on file   Number of children: Not on file   Years of education: Not on file   Highest education level: Not on file  Occupational History   Not on file  Tobacco Use   Smoking status: Never    Passive exposure: Never   Smokeless tobacco: Never  Vaping Use   Vaping Use: Never used  Substance and Sexual Activity   Alcohol use: Never   Drug use: Never   Sexual activity: Not on file  Other Topics Concern   Not on file  Social History Narrative   Not on file   Social Determinants of Health   Financial Resource Strain: Not on file  Food Insecurity: Not on file  Transportation Needs: Not on file  Physical Activity: Not on file  Stress: Not on file  Social Connections: Not on file  Intimate Partner Violence: Not on file  Family History  Problem Relation Age of Onset   Diabetes Mother    Hypertension Father     No past surgical history on file.  ROS: Review of Systems Negative except as stated above  PHYSICAL EXAM: BP 114/75 (BP Location: Left Arm, Patient Position: Sitting, Cuff Size: Large)   Pulse 87   Temp 98.3 F (36.8 C)   Resp 16   Ht 5' 5.75" (1.67 m)   Wt 188 lb (85.3 kg)   SpO2 98%   BMI 30.58 kg/m    Physical Exam HENT:     Head: Normocephalic and atraumatic.     Right Ear: Tympanic membrane and ear canal normal.     Left Ear: Tympanic membrane and ear canal normal.     Nose: Nose normal.     Mouth/Throat:     Mouth: Mucous membranes are moist.     Tonsils: Tonsillar exudate present. 2+ on the right. 2+ on the  left.  Eyes:     Extraocular Movements: Extraocular movements intact.     Conjunctiva/sclera: Conjunctivae normal.     Pupils: Pupils are equal, round, and reactive to light.  Cardiovascular:     Rate and Rhythm: Normal rate and regular rhythm.     Heart sounds: Normal heart sounds.  Pulmonary:     Effort: Pulmonary effort is normal.     Breath sounds: Normal breath sounds.  Musculoskeletal:     Cervical back: Normal range of motion and neck supple.  Neurological:     General: No focal deficit present.     Mental Status: She is alert and oriented to person, place, and time.  Psychiatric:        Mood and Affect: Mood normal.        Behavior: Behavior normal.    Results for orders placed or performed in visit on 12/23/21  Rapid Strep A  Result Value Ref Range   Rapid Strep A Screen Negative Negative    ASSESSMENT AND PLAN: 1. Sore throat 2. Tonsillitis 3. Enlarged tonsils - Patient today in office without respiratory distress.  - Rapid strep A negative.  - Routine screenings pending. - Amoxicillin as prescribed. Counseled on medication adherence.  - Provided with work [employer].  - Referral to ENT for further evaluation/management.  - Rapid Strep A - COVID-19, Flu A+B and RSV - Culture, Group A Strep - Mono, Qual W/Rflx if Negative - amoxicillin (AMOXIL) 500 MG capsule; Take 2 capsules (1,000 mg total) by mouth daily for 10 days.  Dispense: 20 capsule; Refill: 0 - Ambulatory referral to ENT   Patient was given the opportunity to ask questions.  Patient verbalized understanding of the plan and was able to repeat key elements of the plan. Patient was given clear instructions to go to Emergency Department or return to medical center if symptoms don't improve, worsen, or new problems develop.The patient verbalized understanding.   Orders Placed This Encounter  Procedures   COVID-19, Flu A+B and RSV   Culture, Group A Strep   Mono, Qual W/Rflx if Negative   Ambulatory  referral to ENT   Rapid Strep A     Requested Prescriptions   Signed Prescriptions Disp Refills   amoxicillin (AMOXIL) 500 MG capsule 20 capsule 0    Sig: Take 2 capsules (1,000 mg total) by mouth daily for 10 days.    Follow-up with primary provider as scheduled.   Camillia Herter, NP

## 2021-12-23 NOTE — Patient Instructions (Signed)
Tonsillitis  Tonsillitis is an infection of the throat. Tonsils are tissues in the back of your throat. This infection causes the tonsils to become red, tender, and swollen. What are the causes? Tonsillitis is caused by germs (bacteria or a virus). This condition can also occur when pieces of food and bacteria build up around the tonsils. Tonsillitis that is caused by germs can spread from person to person. What are the signs or symptoms? A sore throat. Trouble swallowing. White patches on the tonsils. Swollen tonsils. Fever. Headache. Tiredness. Not feeling hungry. Snoring during sleep when you did not snore before. Foul-smelling, yellowish-white pieces of material that you cough up or spit out. These can cause bad breath. How is this treated? Medicines. These can be given to treat pain, swelling, or fever. They can also be given to kill bacteria. Surgery to take out the tonsils. This is done if you have very bad infections that do not go away. Follow these instructions at home: Medicines Take over-the-counter and prescription medicines only as told by your doctor. If you were prescribed an antibiotic medicine, take it as told by your doctor. Do not stop taking the antibiotic even if you start to feel better. Eating and drinking Drink enough fluid to keep your pee (urine) pale yellow. While your throat is sore, eat soft or liquid foods, such as: Soup. Sherbet. Soft, warm cereals, such as oatmeal or hot wheat cereal. Drink warm fluids. Eat frozen ice pops. General instructions Rest as much as you can, and get plenty of sleep. Rinse your mouth often with salt water. To make salt water, dissolve -1 tsp (3-6 g) of salt in 1 cup (237 mL) of warm water. Do not swallow the salt water. Wash your hands often with soap and water for at least 20 seconds. If there is no soap and water, use hand sanitizer. Do not share cups, bottles, or other utensils until your symptoms are gone. Do not  smoke or use any products that contain nicotine or tobacco. If you need help quitting, ask your doctor. Keep all follow-up visits. Contact a doctor if: You have large, tender lumps in your neck that are new. You have a fever that does not go away after 2-3 days. You have a rash. You cough up green, yellow-brown, or bloody fluid. You cannot swallow liquids or food for 24 hours. Only one of your tonsils is swollen. Get help right away if: You have any new symptoms such as: Vomiting. Very bad headache. Stiff neck. Chest pain. Trouble breathing or swallowing. You have very bad throat pain, and you also have drooling or voice changes. You have very bad pain that is not helped by medicine. You cannot fully open your mouth. You have redness, swelling, or very bad pain anywhere in your neck. Summary Tonsillitis is an infection of the throat. It causes your tonsils to be red, tender, and swollen. While your throat is sore, eat soft or liquid foods. Rinse your mouth often with salt water. Do not share cups, bottles, or other utensils until your symptoms are gone. This information is not intended to replace advice given to you by your health care provider. Make sure you discuss any questions you have with your health care provider. Document Revised: 05/28/2020 Document Reviewed: 05/28/2020 Elsevier Patient Education  2023 Elsevier Inc.  

## 2021-12-24 ENCOUNTER — Other Ambulatory Visit: Payer: Self-pay

## 2021-12-24 LAB — COVID-19, FLU A+B AND RSV
Influenza A, NAA: NOT DETECTED
Influenza B, NAA: NOT DETECTED
RSV, NAA: NOT DETECTED
SARS-CoV-2, NAA: NOT DETECTED

## 2021-12-25 LAB — CULTURE, GROUP A STREP: Strep A Culture: NEGATIVE

## 2021-12-28 LAB — EBV ACUTE INFECTION ANTIBODIES
EBV Early Antigen Ab, IgG: <9 U/mL (ref 0.0–8.9)
EBV NA IgG: 463 U/mL — ABNORMAL HIGH (ref 0.0–17.9)
EBV VCA IgG: 279 U/mL — ABNORMAL HIGH (ref 0.0–17.9)
EBV VCA IgM: <36 U/mL (ref 0.0–35.9)

## 2021-12-28 LAB — MONO, QUAL W/RFLX IF NEGATIVE: Mono Screen: NEGATIVE

## 2022-01-17 DIAGNOSIS — E0590 Thyrotoxicosis, unspecified without thyrotoxic crisis or storm: Secondary | ICD-10-CM

## 2022-01-17 HISTORY — DX: Thyrotoxicosis, unspecified without thyrotoxic crisis or storm: E05.90

## 2022-01-20 ENCOUNTER — Other Ambulatory Visit: Payer: Self-pay | Admitting: Family

## 2022-01-20 ENCOUNTER — Encounter: Payer: Self-pay | Admitting: Family

## 2022-01-20 ENCOUNTER — Ambulatory Visit: Payer: 59 | Admitting: Internal Medicine

## 2022-01-20 ENCOUNTER — Encounter: Payer: Self-pay | Admitting: Internal Medicine

## 2022-01-20 VITALS — BP 124/82 | HR 100 | Ht 65.75 in | Wt 194.0 lb

## 2022-01-20 DIAGNOSIS — J351 Hypertrophy of tonsils: Secondary | ICD-10-CM

## 2022-01-20 DIAGNOSIS — E0590 Thyrotoxicosis, unspecified without thyrotoxic crisis or storm: Secondary | ICD-10-CM

## 2022-01-20 LAB — T4, FREE: Free T4: 0.73 ng/dL (ref 0.60–1.60)

## 2022-01-20 LAB — TSH: TSH: 0.53 u[IU]/mL (ref 0.35–5.50)

## 2022-01-20 MED ORDER — METHIMAZOLE 5 MG PO TABS: 2.5000 mg | ORAL_TABLET | Freq: Every day | ORAL | 3 refills | Status: DC

## 2022-01-20 NOTE — Progress Notes (Signed)
Name: Ashley Petersen  MRN/ DOB: [id number], August 25, 2000    Age/ Sex: 22 y.o., female    PCP: Camillia Herter, NP   Reason for Endocrinology Evaluation: Subclinical Hyperthyroidism     Date of Initial Endocrinology Evaluation: 09/01/2021    HPI: [name] is a 22 y.o. female with a past medical history of Subclinical Hyperthyroidism. The patient presented for initial endocrinology clinic visit on 09/01/2021 for consultative assistance with her Subclinical Hyperthyroidism .   She has been diagnosed with subclinical hyperthyroidism in 12/2020 with a TSH nadir of 0.210 uIU/ml and normal FT4 and T3.  On her initial visit to our clinic she had a TSH of 0.32 uIU/mL with normal free T4 and T3, but we opted to start on methimazole due to symptoms of tremors, palpitations, and shortness of breath Patient was started on methimazole 08/2021    TRAB was not elevated but it was detectable at 1.79 IU/L  No family history of thyroid disease  SUBJECTIVE:    Today (01/20/22):  [name] is here for a follow up on hyperthyroidism.   Weight has been fluctuating  Palpitations have been improving  Denies tremors  anxiety and SOB have improved  Denies local neck swelling  Loose stools resolved  Mild sweating    Methimazole 5 mg, half a tablet daily     HISTORY:  Past Medical History:  Past Medical History:  Diagnosis Date   Asthma    Past Surgical History: No past surgical history on file.  Social History:  reports that she has never smoked. She has never been exposed to tobacco smoke. She has never used smokeless tobacco. She reports that she does not drink alcohol and does not use drugs. Family History: family history includes Diabetes in her mother; Hypertension in her father.   HOME MEDICATIONS: Allergies as of 01/20/2022       Reactions   Pecan Nut (diagnostic) Hives, Itching, Shortness Of Breath, Swelling   Tree Extract Anaphylaxis   From an allergy test From an  allergy test   Peanut-containing Drug Products Swelling, Other (See Comments)        Medication List        Accurate as of January 20, 2022 10:09 AM. If you have any questions, ask your nurse or doctor.          albuterol 108 (90 Base) MCG/ACT inhaler Commonly known as: VENTOLIN HFA Inhale 1-2 puffs into the lungs every 6 (six) hours as needed for wheezing or shortness of breath.   beclomethasone 40 MCG/ACT inhaler Commonly known as: QVAR Inhale 2 puffs into the lungs 2 (two) times daily.   EPINEPHrine 0.3 mg/0.3 mL Soaj injection Commonly known as: EpiPen 2-Pak Inject 0.3 mg into the muscle as needed for anaphylaxis.   methimazole 5 MG tablet Commonly known as: TAPAZOLE Take 0.5 tablets (2.5 mg total) by mouth daily.   naproxen 500 MG tablet Commonly known as: NAPROSYN Take 1 tablet (500 mg total) by mouth daily as needed.          REVIEW OF SYSTEMS: A comprehensive ROS was conducted with the patient and is negative except as per HPI     OBJECTIVE:  VS: BP 124/82 (BP Location: Left Arm, Patient Position: Sitting, Cuff Size: Large)   Pulse 100   Ht 5' 5.75" (1.67 m)   Wt 194 lb (88 kg)   SpO2 99%   BMI 31.55 kg/m    Wt Readings from Last 3 Encounters:  01/20/22 194 lb (88 kg)  12/23/21 188 lb (85.3 kg)  09/01/21 189 lb 12.8 oz (86.1 kg)     EXAM: General: Pt appears well and is in NAD  Eyes: External eye exam normal without stare, lid lag or exophthalmos.  EOM intact.    Neck: General: Supple without adenopathy. Thyroid: Thyroid size normal.  No goiter or nodules appreciated. No thyroid bruit.  Lungs: Clear with good BS bilat with no rales, rhonchi, or wheezes  Heart: Auscultation: RRR.  Abdomen: Normoactive bowel sounds, soft, nontender, without masses or organomegaly palpable  Extremities:  BL LE: No pretibial edema normal ROM and strength.  Mental Status: Judgment, insight: Intact Orientation: Oriented to time, place, and person Mood and  affect: No depression, anxiety, or agitation     DATA REVIEWED:   Latest Reference Range & Units 01/20/22 10:15  TSH 0.35 - 5.50 uIU/mL 0.53  T4,Free(Direct) 0.60 - 1.60 ng/dL 0.73      Latest Reference Range & Units 12/30/20 13:21  Potassium 3.5 - 5.2 mmol/L 4.1  Chloride 96 - 106 mmol/L 100  CO2 20 - 29 mmol/L 24  Glucose 70 - 99 mg/dL 79  BUN 6 - 20 mg/dL 8  Creatinine 0.57 - 1.00 mg/dL 0.60  Calcium 8.7 - 10.2 mg/dL 9.9  BUN/Creatinine Ratio 9 - 23  13  eGFR >59 mL/min/1.73 131  Alkaline Phosphatase 44 - 121 IU/L 58  Albumin 3.9 - 5.0 g/dL 4.7  Albumin/Globulin Ratio 1.2 - 2.2  1.8  AST 0 - 40 IU/L 13  ALT 0 - 32 IU/L 9  Total Protein 6.0 - 8.5 g/dL 7.3  Total Bilirubin 0.0 - 1.2 mg/dL 0.5    Latest Reference Range & Units 12/30/20 13:21  WBC 3.4 - 10.8 x10E3/uL 4.2  RBC 3.77 - 5.28 x10E6/uL 5.36 (H)  Hemoglobin 11.1 - 15.9 g/dL 11.0 (L)  HCT 34.0 - 46.6 % 36.6  MCV 79 - 97 fL 68 (L)  MCH 26.6 - 33.0 pg 20.5 (L)  MCHC 31.5 - 35.7 g/dL 30.1 (L)  RDW 11.7 - 15.4 % 17.3 (H)  Platelets 150 - 450 x10E3/uL 325     Latest Reference Range & Units 09/01/21 08:59  TRAB <=2.00 IU/L 1.79     ASSESSMENT/PLAN/RECOMMENDATIONS:   Subclinical Hyperthyroidism:  - Pt is clinically euthyroid  - High suspicion for Graves' disease, TRAB  detectable but not elevated -TFTs normal, continue current dose of methimazole  Medications : Continue methimazole 5 mg, half a tablet daily   Signed electronically by: Mack Guise, MD  [facility]  [address] [place], [place] [address] Phone: [phone] FAX: [phone]   CC: Camillia Herter, NP [address] [place] [address] Phone: [phone] Fax: [phone]   Return to Endocrinology clinic as below: No future appointments.

## 2022-01-20 NOTE — Telephone Encounter (Signed)
Order complete. 

## 2022-03-01 ENCOUNTER — Encounter: Payer: Self-pay | Admitting: Internal Medicine

## 2022-03-16 ENCOUNTER — Ambulatory Visit
Admission: EM | Admit: 2022-03-16 | Discharge: 2022-03-16 | Disposition: A | Discharge status: Home / Self Care | Payer: 59 | Attending: Internal Medicine | Admitting: Internal Medicine

## 2022-03-16 DIAGNOSIS — Z20822 Contact with and (suspected) exposure to covid-19: Secondary | ICD-10-CM | POA: Diagnosis present

## 2022-03-16 DIAGNOSIS — J029 Acute pharyngitis, unspecified: Secondary | ICD-10-CM | POA: Diagnosis present

## 2022-03-16 LAB — POCT RAPID STREP A (OFFICE): Rapid Strep A Screen: NEGATIVE

## 2022-03-16 LAB — POCT MONO SCREEN (KUC): Mono, POC: NEGATIVE

## 2022-03-16 MED ORDER — CETIRIZINE HCL 10 MG PO TABS: 10.0000 mg | ORAL_TABLET | Freq: Every day | ORAL | 0 refills | Status: DC

## 2022-03-16 MED ORDER — FLUTICASONE PROPIONATE 50 MCG/ACT NA SUSP: 1.0000 | Freq: Every day | NASAL | 0 refills | Status: DC

## 2022-03-16 NOTE — ED Triage Notes (Signed)
Pt presents with sore throat and otalgia (both L side) x 2 days.  No fever or other s/s incl congestion. Reports frequent episodes of tonsiliities in last year, sees ENT in April.

## 2022-03-16 NOTE — ED Provider Notes (Signed)
[name]    CSN: [id number] Arrival date & time: 03/16/22  1204      History   Chief Complaint Chief Complaint  Patient presents with   Sore Throat    HPI Ashley Petersen is a 22 y.o. female.   Patient presents with sore throat and ear pain that started 2 days ago.  Denies nasal congestion, runny nose, cough.  Denies any known sick contacts.  Patient reports history of recurrent tonsillitis over the past few months.  She has appointment with ENT in April.  Reports she was treated with amoxicillin in December and has had no flareups ever since.  Has taken Advil for symptoms with minimal improvement.   Sore Throat    Past Medical History:  Diagnosis Date   Asthma    Hyperthyroidism 01/2022    Patient Active Problem List   Diagnosis Date Noted   Bacterial vaginitis 01/01/2021   Candida vaginitis 01/01/2021   Asthma 12/30/2020   H/O renal calculi 12/18/2013    History reviewed. No pertinent surgical history.  OB History   No obstetric history on file.      Home Medications    Prior to Admission medications   Medication Sig Start Date End Date Taking? Authorizing Provider  albuterol (VENTOLIN HFA) 108 (90 Base) MCG/ACT inhaler Inhale 1-2 puffs into the lungs every 6 (six) hours as needed for wheezing or shortness of breath. 12/30/20  Yes Minette Brine, Amy J, NP  beclomethasone (QVAR) 40 MCG/ACT inhaler Inhale 2 puffs into the lungs 2 (two) times daily. 12/30/20  Yes Minette Brine, Amy J, NP  cetirizine (ZYRTEC) 10 MG tablet Take 1 tablet (10 mg total) by mouth daily. 03/16/22  Yes Leilanny Fluitt, Hildred Alamin E, FNP  EPINEPHrine (EPIPEN 2-PAK) 0.3 mg/0.3 mL IJ SOAJ injection Inject 0.3 mg into the muscle as needed for anaphylaxis. 12/15/21  Yes Minette Brine, Amy J, NP  fluticasone (FLONASE) 50 MCG/ACT nasal spray Place 1 spray into both nostrils daily. 03/16/22  Yes Haydin Calandra, Hildred Alamin E, FNP  methimazole (TAPAZOLE) 5 MG tablet Take 0.5 tablets (2.5 mg total) by mouth daily. 01/20/22  Yes  Shamleffer, Melanie Crazier, MD  naproxen (NAPROSYN) 500 MG tablet Take 1 tablet (500 mg total) by mouth daily as needed. 12/15/21  Yes Camillia Herter, NP    Family History Family History  Problem Relation Age of Onset   Diabetes Mother    Hypertension Father     Social History Social History   Tobacco Use   Smoking status: Never    Passive exposure: Never   Smokeless tobacco: Never  Vaping Use   Vaping Use: Never used  Substance Use Topics   Alcohol use: Never   Drug use: Never     Allergies   Pecan nut (diagnostic), Tree extract, and Peanut-containing drug products   Review of Systems Review of Systems Per HPI  Physical Exam Triage Vital Signs ED Triage Vitals  Enc Vitals Group     BP 03/16/22 1221 120/83     Pulse Rate 03/16/22 1221 (!) 104     Resp 03/16/22 1221 16     Temp 03/16/22 1221 98.2 F (36.8 C)     Temp Source 03/16/22 1221 Oral     SpO2 03/16/22 1221 96 %     Weight --      Height --      Head Circumference --      Peak Flow --      Pain Score 03/16/22 1217 6  Pain Loc --      Pain Edu? --      Excl. in [place]? --    No data found.  Updated Vital Signs BP 120/83 (BP Location: Left Arm)   Pulse 86   Temp 98.2 F (36.8 C) (Oral)   Resp 16   LMP 03/01/2022 (Exact Date)   SpO2 96%   Visual Acuity Right Eye Distance:   Left Eye Distance:   Bilateral Distance:    Right Eye Near:   Left Eye Near:    Bilateral Near:     Physical Exam Constitutional:      General: She is not in acute distress.    Appearance: Normal appearance. She is not toxic-appearing or diaphoretic.  HENT:     Head: Normocephalic and atraumatic.     Right Ear: Ear canal normal. No drainage, swelling or tenderness. A middle ear effusion is present. Tympanic membrane is not perforated, erythematous or bulging.     Left Ear: Ear canal normal. No drainage, swelling or tenderness. A middle ear effusion is present. Tympanic membrane is not perforated, erythematous or  bulging.     Nose: No congestion.     Mouth/Throat:     Mouth: Mucous membranes are moist.     Pharynx: Posterior oropharyngeal erythema present. No oropharyngeal exudate.     Tonsils: No tonsillar exudate or tonsillar abscesses.  Eyes:     Extraocular Movements: Extraocular movements intact.     Conjunctiva/sclera: Conjunctivae normal.     Pupils: Pupils are equal, round, and reactive to light.  Cardiovascular:     Rate and Rhythm: Normal rate and regular rhythm.     Pulses: Normal pulses.     Heart sounds: Normal heart sounds.  Pulmonary:     Effort: Pulmonary effort is normal. No respiratory distress.     Breath sounds: Normal breath sounds. No stridor. No wheezing, rhonchi or rales.  Abdominal:     General: Abdomen is flat. Bowel sounds are normal.     Palpations: Abdomen is soft.  Musculoskeletal:        General: Normal range of motion.     Cervical back: Normal range of motion.  Skin:    General: Skin is warm and dry.  Neurological:     General: No focal deficit present.     Mental Status: She is alert and oriented to person, place, and time. Mental status is at baseline.  Psychiatric:        Mood and Affect: Mood normal.        Behavior: Behavior normal.      UC Treatments / Results  Labs (all labs ordered are listed, but only abnormal results are displayed) Labs Reviewed  SARS CORONAVIRUS 2 (TAT 6-24 HRS)  CULTURE, GROUP A STREP [facility])  POCT RAPID STREP A (OFFICE)  POCT MONO SCREEN ([facility])    EKG   Radiology No results found.  Procedures Procedures (including critical care time)  Medications Ordered in UC Medications - No data to display  Initial Impression / Assessment and Plan / UC Course  I have reviewed the triage vital signs and the nursing notes.  Pertinent labs & imaging results that were available during my care of the patient were reviewed by me and considered in my medical decision making (see chart for details).     Rapid strep and  rapid mono were negative.  Throat culture and COVID test pending.  Suspect viral cause to patient's symptoms.  Patient does have fluid behind  TMs which is most likely attributing to ear pain.  Will treat with antihistamine and Flonase as patient denies that she takes any of these daily.  Advised continuing follow-up with ENT in April given this is a recurrent issue.  No signs of bacterial infection on exam so will defer antibiotics.  Discussed return precautions.  Patient verbalized understanding and was agreeable with plan. Final Clinical Impressions(s) / UC Diagnoses   Final diagnoses:  Encounter for laboratory testing for COVID-19 virus  Viral pharyngitis     Discharge Instructions      Strep and rapid mono were negative.  Throat culture and COVID test are pending.  It appears that you have a viral cause to your symptoms.  I have prescribed 2 medications to help alleviate symptoms.  Please follow-up if any symptoms persist or worsen.     ED Prescriptions     Medication Sig Dispense Auth. Provider   cetirizine (ZYRTEC) 10 MG tablet Take 1 tablet (10 mg total) by mouth daily. 30 tablet [place], [place] E, [place]   fluticasone [facility]) 50 MCG/ACT nasal spray Place 1 spray into both nostrils daily. 16 g Teodora Medici, [place]      PDMP not reviewed this encounter.   Teodora Medici, [place] 03/16/22 1320

## 2022-03-16 NOTE — Discharge Instructions (Signed)
Strep and rapid mono were negative.  Throat culture and COVID test are pending.  It appears that you have a viral cause to your symptoms.  I have prescribed 2 medications to help alleviate symptoms.  Please follow-up if any symptoms persist or worsen.

## 2022-03-17 LAB — SARS CORONAVIRUS 2 (TAT 6-24 HRS): SARS Coronavirus 2: NEGATIVE

## 2022-03-19 LAB — CULTURE, GROUP A STREP (THRC)

## 2022-03-21 ENCOUNTER — Encounter: Payer: Self-pay | Admitting: Internal Medicine

## 2022-03-21 ENCOUNTER — Telehealth: Payer: 59 | Admitting: Physician Assistant

## 2022-03-21 DIAGNOSIS — J028 Acute pharyngitis due to other specified organisms: Secondary | ICD-10-CM

## 2022-03-21 DIAGNOSIS — B9689 Other specified bacterial agents as the cause of diseases classified elsewhere: Secondary | ICD-10-CM

## 2022-03-21 MED ORDER — AMOXICILLIN 500 MG PO CAPS: 500.0000 mg | ORAL_CAPSULE | Freq: Two times a day (BID) | ORAL | 0 refills | Status: AC

## 2022-03-21 NOTE — Progress Notes (Signed)
Virtual Visit Consent   Ashley Petersen, you are scheduled for a virtual visit with a [facility] provider today. Just as with appointments in the office, your consent must be obtained to participate. Your consent will be active for this visit and any virtual visit you may have with one of our providers in the next 365 days. If you have a MyChart account, a copy of this consent can be sent to you electronically.  As this is a virtual visit, video technology does not allow for your provider to perform a traditional examination. This may limit your provider's ability to fully assess your condition. If your provider identifies any concerns that need to be evaluated in person or the need to arrange testing (such as labs, EKG, etc.), we will make arrangements to do so. Although advances in technology are sophisticated, we cannot ensure that it will always work on either your end or our end. If the connection with a video visit is poor, the visit may have to be switched to a telephone visit. With either a video or telephone visit, we are not always able to ensure that we have a secure connection.  By engaging in this virtual visit, you consent to the provision of healthcare and authorize for your insurance to be billed (if applicable) for the services provided during this visit. Depending on your insurance coverage, you may receive a charge related to this service.  I need to obtain your verbal consent now. Are you willing to proceed with your visit today? Lerae Counce has provided verbal consent on 03/21/2022 for a virtual visit (video or telephone). Mar Daring, PA-C  Date: 03/21/2022 12:20 PM  Virtual Visit via Video Note   I, Mar Daring, connected with  Chiante Genna  ([id number], Jun 23, 2000) on 03/21/22 at 12:15 PM EST by a video-enabled telemedicine application and verified that I am speaking with the correct person using two identifiers.  Location: Patient: Virtual Visit Location  Patient: Home Provider: Virtual Visit Location Provider: Home Office   I discussed the limitations of evaluation and management by telemedicine and the availability of in person appointments. The patient expressed understanding and agreed to proceed.    History of Present Illness: Ashley Petersen is a 22 y.o. who identifies as a female who was assigned female at birth, and is being seen today for sore throat.  HPI: Sore Throat  This is a new problem. The current episode started 1 to 4 weeks ago (Went to UC on 03/16/22, diagnosed with viral pharyngitis). The problem has been gradually worsening. The maximum temperature recorded prior to her arrival was 100.4 - 100.9 F. The fever has been present for Less than 1 day (today). Associated symptoms include congestion, ear pain (left), swollen glands and trouble swallowing. Pertinent negatives include no coughing, diarrhea, ear discharge, headaches, hoarse voice, neck pain, shortness of breath or vomiting. Associated symptoms comments: Post nasal drainage. Treatments tried: fluticasone, tylenol. The treatment provided mild relief.     Problems:  Patient Active Problem List   Diagnosis Date Noted   Bacterial vaginitis 01/01/2021   Candida vaginitis 01/01/2021   Asthma 12/30/2020   H/O renal calculi 12/18/2013    Allergies:  Allergies  Allergen Reactions   Pecan Nut (Diagnostic) Hives, Itching, Shortness Of Breath and Swelling   Tree Extract Anaphylaxis    From an allergy test From an allergy test    Peanut-Containing Drug Products Swelling and Other (See Comments)   Medications:  Current Outpatient Medications:  amoxicillin (AMOXIL) 500 MG capsule, Take 1 capsule (500 mg total) by mouth 2 (two) times daily for 10 days., Disp: 20 capsule, Rfl: 0   albuterol (VENTOLIN HFA) 108 (90 Base) MCG/ACT inhaler, Inhale 1-2 puffs into the lungs every 6 (six) hours as needed for wheezing or shortness of breath., Disp: 18 g, Rfl: 1   beclomethasone  (QVAR) 40 MCG/ACT inhaler, Inhale 2 puffs into the lungs 2 (two) times daily., Disp: 1 each, Rfl: 2   cetirizine (ZYRTEC) 10 MG tablet, Take 1 tablet (10 mg total) by mouth daily., Disp: 30 tablet, Rfl: 0   EPINEPHrine (EPIPEN 2-PAK) 0.3 mg/0.3 mL IJ SOAJ injection, Inject 0.3 mg into the muscle as needed for anaphylaxis., Disp: 2 each, Rfl: 1   fluticasone (FLONASE) 50 MCG/ACT nasal spray, Place 1 spray into both nostrils daily., Disp: 16 g, Rfl: 0   methimazole (TAPAZOLE) 5 MG tablet, Take 0.5 tablets (2.5 mg total) by mouth daily., Disp: 45 tablet, Rfl: 3   naproxen (NAPROSYN) 500 MG tablet, Take 1 tablet (500 mg total) by mouth daily as needed., Disp: 30 tablet, Rfl: 2  Observations/Objective: Patient is well-developed, well-nourished in no acute distress.  Resting comfortably at home.  Head is normocephalic, atraumatic.  No labored breathing.  Speech is clear and coherent with logical content.  Patient is alert and oriented at baseline.    Assessment and Plan: 1. Bacterial pharyngitis - amoxicillin (AMOXIL) 500 MG capsule; Take 1 capsule (500 mg total) by mouth 2 (two) times daily for 10 days.  Dispense: 20 capsule; Refill: 0  - Suspect strep throat - Amoxicillin prescribed - Tylenol and Ibuprofen alternating every 4 hours - Salt water gargles - Chloraseptic spray - Liquid and soft food diet - Push fluids - New toothbrush in 3 days - Seek in person evaluation if not improving or if symptoms worsen   Follow Up Instructions: I discussed the assessment and treatment plan with the patient. The patient was provided an opportunity to ask questions and all were answered. The patient agreed with the plan and demonstrated an understanding of the instructions.  A copy of instructions were sent to the patient via MyChart unless otherwise noted below.    The patient was advised to call back or seek an in-person evaluation if the symptoms worsen or if the condition fails to improve as  anticipated.  Time:  I spent 8 minutes with the patient via telehealth technology discussing the above problems/concerns.    Mar Daring, PA-C

## 2022-03-21 NOTE — Patient Instructions (Signed)
Ashley Petersen, thank you for joining Mar Daring, PA-C for today's virtual visit.  While this provider is not your primary care provider (PCP), if your PCP is located in our provider database this encounter information will be shared with them immediately following your visit.   [facility] account gives you access to today's visit and all your visits, tests, and labs performed at [facility] " click here if you don't have a [facility] account or go to [email or web address]  Consent: (Patient) Ashley Petersen provided verbal consent for this virtual visit at the beginning of the encounter.  Current Medications:  Current Outpatient Medications:    amoxicillin (AMOXIL) 500 MG capsule, Take 1 capsule (500 mg total) by mouth 2 (two) times daily for 10 days., Disp: 20 capsule, Rfl: 0   albuterol (VENTOLIN HFA) 108 (90 Base) MCG/ACT inhaler, Inhale 1-2 puffs into the lungs every 6 (six) hours as needed for wheezing or shortness of breath., Disp: 18 g, Rfl: 1   beclomethasone (QVAR) 40 MCG/ACT inhaler, Inhale 2 puffs into the lungs 2 (two) times daily., Disp: 1 each, Rfl: 2   cetirizine (ZYRTEC) 10 MG tablet, Take 1 tablet (10 mg total) by mouth daily., Disp: 30 tablet, Rfl: 0   EPINEPHrine (EPIPEN 2-PAK) 0.3 mg/0.3 mL IJ SOAJ injection, Inject 0.3 mg into the muscle as needed for anaphylaxis., Disp: 2 each, Rfl: 1   fluticasone (FLONASE) 50 MCG/ACT nasal spray, Place 1 spray into both nostrils daily., Disp: 16 g, Rfl: 0   methimazole (TAPAZOLE) 5 MG tablet, Take 0.5 tablets (2.5 mg total) by mouth daily., Disp: 45 tablet, Rfl: 3   naproxen (NAPROSYN) 500 MG tablet, Take 1 tablet (500 mg total) by mouth daily as needed., Disp: 30 tablet, Rfl: 2   Medications ordered in this encounter:  Meds ordered this encounter  Medications   amoxicillin (AMOXIL) 500 MG capsule    Sig: Take 1 capsule (500 mg total) by mouth 2 (two) times daily for 10 days.    Dispense:   20 capsule    Refill:  0    Order Specific Question:   Supervising Provider    Answer:   Chase Picket [id number]     *If you need refills on other medications prior to your next appointment, please contact your pharmacy*  Follow-Up: Call back or seek an in-person evaluation if the symptoms worsen or if the condition fails to improve as anticipated.  [facility] [phone]  Other Instructions  Pharyngitis  Pharyngitis is inflammation of the throat (pharynx). It is a very common cause of sore throat. Pharyngitis can be caused by a bacteria, but it is usually caused by a virus. Most cases of pharyngitis get better on their own without treatment. What are the causes? This condition may be caused by: Infection by viruses (viral). Viral pharyngitis spreads easily from person to person (is contagious) through coughing, sneezing, and sharing of personal items or utensils such as cups, forks, spoons, and toothbrushes. Infection by bacteria (bacterial). Bacterial pharyngitis may be spread by touching the nose or face after coming in contact with the bacteria, or through close contact, such as kissing. Allergies. Allergies can cause buildup of mucus in the throat (post-nasal drip), leading to inflammation and irritation. Allergies can also cause blocked nasal passages, forcing breathing through the mouth, which dries and irritates the throat. What increases the risk? You are more likely to develop this condition if: You are 7-55 years old. You  are exposed to crowded environments such as daycare, school, or dormitory living. You live in a cold climate. You have a weakened disease-fighting (immune) system. What are the signs or symptoms? Symptoms of this condition vary by the cause. Common symptoms of this condition include: Sore throat. Fatigue. Low-grade fever. Stuffy nose (nasal congestion) and cough. Headache. Other symptoms may include: Glands in the neck (lymph  nodes) that are swollen. Skin rashes. Plaque-like film on the throat or tonsils. This is often a symptom of bacterial pharyngitis. Vomiting. Red, itchy eyes (conjunctivitis). Loss of appetite. Joint pain and muscle aches. Enlarged tonsils. How is this diagnosed? This condition may be diagnosed based on your medical history and a physical exam. Your health care provider will ask you questions about your illness and your symptoms. A swab of your throat may be done to check for bacteria (rapid strep test). Other lab tests may also be done, depending on the suspected cause, but these are rare. How is this treated? Many times, treatment is not needed for this condition. Pharyngitis usually gets better in 3-4 days without treatment. Bacterial pharyngitis may be treated with antibiotic medicines. Follow these instructions at home: Medicines Take over-the-counter and prescription medicines only as told by your health care provider. If you were prescribed an antibiotic medicine, take it as told by your health care provider. Do not stop taking the antibiotic even if you start to feel better. Use throat sprays to soothe your throat as told by your health care provider. Children can get pharyngitis. Do not give your child aspirin because of the association with Reye's syndrome. Managing pain To help with pain, try: Sipping warm liquids, such as broth, herbal tea, or warm water. Eating or drinking cold or frozen liquids, such as frozen ice pops. Gargling with a mixture of salt and water 3-4 times a day or as needed. To make salt water, completely dissolve -1 tsp (3-6 g) of salt in 1 cup (237 mL) of warm water. Sucking on hard candy or throat lozenges. Putting a cool-mist humidifier in your bedroom at night to moisten the air. Sitting in the bathroom with the door closed for 5-10 minutes while you run hot water in the shower.  General instructions  Do not use any products that contain nicotine or  tobacco. These products include cigarettes, chewing tobacco, and vaping devices, such as e-cigarettes. If you need help quitting, ask your health care provider. Rest as told by your health care provider. Drink enough fluid to keep your urine pale yellow. How is this prevented? To help prevent becoming infected or spreading infection: Wash your hands often with soap and water for at least 20 seconds. If soap and water are not available, use hand sanitizer. Do not touch your eyes, nose, or mouth with unwashed hands, and wash hands after touching these areas. Do not share cups or eating utensils. Avoid close contact with people who are sick. Contact a health care provider if: You have large, tender lumps in your neck. You have a rash. You cough up green, yellow-brown, or bloody mucus. Get help right away if: Your neck becomes stiff. You drool or are unable to swallow liquids. You cannot drink or take medicines without vomiting. You have severe pain that does not go away, even after you take medicine. You have trouble breathing, and it is not caused by a stuffy nose. You have new pain and swelling in your joints such as the knees, ankles, wrists, or elbows. These symptoms may represent  a serious problem that is an emergency. Do not wait to see if the symptoms will go away. Get medical help right away. Call your local emergency services (911 in the [place] Do not drive yourself to the hospital. Summary Pharyngitis is redness, pain, and swelling (inflammation) of the throat (pharynx). While pharyngitis can be caused by a bacteria, the most common causes are viral. Most cases of pharyngitis get better on their own without treatment. Bacterial pharyngitis is treated with antibiotic medicines. This information is not intended to replace advice given to you by your health care provider. Make sure you discuss any questions you have with your health care provider. Document Revised: 04/01/2020 Document  Reviewed: 04/01/2020 Elsevier Patient Education  [place].    If you have been instructed to have an in-person evaluation today at a local Urgent Care facility, please use the link below. It will take you to a list of all of our available [facility], including address, phone number and hours of operation. Please do not delay care.  [facility]  If you or a family member do not have a primary care provider, use the link below to schedule a visit and establish care. When you choose a [facility] primary care physician or advanced practice provider, you gain a long-term partner in health. Find a Primary Care Provider  Learn more about 's in-office and virtual care options: [facility]

## 2022-03-22 MED ORDER — METHIMAZOLE 5 MG PO TABS: 2.5000 mg | ORAL_TABLET | Freq: Every day | ORAL | 1 refills | Status: DC

## 2022-04-07 ENCOUNTER — Encounter: Payer: Self-pay | Admitting: Internal Medicine

## 2022-04-07 ENCOUNTER — Ambulatory Visit: Payer: 59 | Admitting: Internal Medicine

## 2022-04-07 ENCOUNTER — Other Ambulatory Visit: Payer: Self-pay

## 2022-04-07 ENCOUNTER — Other Ambulatory Visit (HOSPITAL_COMMUNITY)
Admission: RE | Admit: 2022-04-07 | Discharge: 2022-04-07 | Disposition: A | Discharge status: Home / Self Care | Payer: 59 | Source: Ambulatory Visit | Attending: Student in an Organized Health Care Education/Training Program | Admitting: Student in an Organized Health Care Education/Training Program

## 2022-04-07 VITALS — BP 130/69 | HR 98 | Temp 97.6°F | Ht 65.0 in | Wt 198.5 lb

## 2022-04-07 DIAGNOSIS — J039 Acute tonsillitis, unspecified: Secondary | ICD-10-CM | POA: Insufficient documentation

## 2022-04-07 NOTE — Assessment & Plan Note (Addendum)
The patient has a history of recurrent tonsillitis -she has had 3 episodes in the past 3 months.  Each time, her symptoms have improved after a course of amoxicillin, but her symptoms recur shortly after. Strep testing has been negative in the past, as well as acute EBV (she does have IgG Abs to EBV, but no IgM). COVID, flu, RSV also have all been negative.  She saw an ENT doctor today, who advised against surgery because she has not had 5-6 recurrent infections yet.   The patient states that she does have a sore throat now and feels like another infection may be starting again. She denies any fevers, chills, muffled voice, neck pain, or dysphagia.  Assessment: I wonder if the patient has an atypical cause of pharyngitis/tonsillitis, that has not been appropriately treated yet.  A viral illness is less likely, given that flu, COVID, and RSV testing have been negative, but acute HIV has not been ruled out.  As far as bacterial etiologies, group A strep has been negative, but group C or G strep have not been ruled out, as well as gonorrhea, chlamydia, or syphilis (the patient denies any exposures to STIs, stating that she is monogamous with her boyfriend and he is monogamous as well).  TB, tularemia, and Yersinia are less likely given that the patient does not have lymphadenopathy.  Diphtheria is also less likely given that the patient does not have a cough.  Thyroiditis is less likely given that the patient has no neck pain and a normal thyroid exam, although the patient does have a history of hypothyroidism).  Environmental factors such as smoke, air pollution, and occupational exposures are also less likely given that the patient does not endorse any fume exposures (she works as a Chief Executive Officer).  She does vape about once a day though, which could be an exposure.  Plan: - HIV - RPR - Respiratory culture (unfortunately does not check for group C or G strep) - Gonorrhea chlamydia (oral swab) - Consider  second opinion ENT if workup negative and patient continues to have recurrent episodes

## 2022-04-07 NOTE — Progress Notes (Signed)
CC: tonsillitis  HPI:  Ashley Petersen is a 22 y.o. female living with a history stated below and presents today for tonsillitis. Please see problem based assessment and plan for additional details.  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 Has no sexual partners other than her boyfriend and he also does not have any other partners.   Past Medical History:  Diagnosis Date   Asthma    Hyperthyroidism 01/2022    Current Outpatient Medications on File Prior to Visit  Medication Sig Dispense Refill   albuterol (VENTOLIN HFA) 108 (90 Base) MCG/ACT inhaler Inhale 1-2 puffs into the lungs every 6 (six) hours as needed for wheezing or shortness of breath. 18 g 1   beclomethasone (QVAR) 40 MCG/ACT inhaler Inhale 2 puffs into the lungs 2 (two) times daily. 1 each 2   cetirizine (ZYRTEC) 10 MG tablet Take 1 tablet (10 mg total) by mouth daily. 30 tablet 0   EPINEPHrine (EPIPEN 2-PAK) 0.3 mg/0.3 mL IJ SOAJ injection Inject 0.3 mg into the muscle as needed for anaphylaxis. 2 each 1   fluticasone (FLONASE) 50 MCG/ACT nasal spray Place 1 spray into both nostrils daily. 16 g 0   methimazole (TAPAZOLE) 5 MG tablet Take 0.5 tablets (2.5 mg total) by mouth daily. 45 tablet 1   naproxen (NAPROSYN) 500 MG tablet Take 1 tablet (500 mg total) by mouth daily as needed. 30 tablet 2   No current facility-administered medications on file prior to visit.    Family History  Problem Relation Age of Onset   Diabetes Mother    Hypertension Father     Social History   Socioeconomic History   Marital status: Single    Spouse name: Not on file   Number of children: Not on file   Years of education: Not on file    Highest education level: Not on file  Occupational History   Not on file  Tobacco Use   Smoking status: Never    Passive exposure: Never   Smokeless tobacco: Never  Vaping Use   Vaping Use: Never used  Substance and Sexual Activity   Alcohol use: Never   Drug use: Never   Sexual activity: Yes    Birth control/protection: None, Condom  Other Topics Concern   Not on file  Social History Narrative   Not on file   Social Determinants of Health   Financial Resource Strain: Low Risk  (04/07/2022)   Overall Financial Resource Strain (CARDIA)    Difficulty of Paying Living Expenses: Not hard at all  Food Insecurity: No Food Insecurity (04/07/2022)   Hunger Vital Sign    Worried About Running Out of Food in the Last Year: Never true    Ran Out of Food in the Last Year: Never true  Transportation Needs: No Transportation Needs (04/07/2022)   PRAPARE - Hydrologist (Medical): No    Lack of Transportation (Non-Medical): No  Physical Activity: Sufficiently Active (04/07/2022)   Exercise Vital Sign    Days of Exercise per Week: 7 days    Minutes of Exercise per Session: 30 min  Stress: No Stress Concern Present (04/07/2022)   [place] -  Occupational Stress Questionnaire    Feeling of Stress : Not at all  Social Connections: Moderately Isolated (04/07/2022)   Social Connection and Isolation Panel [NHANES]    Frequency of Communication with Friends and Family: More than three times a week    Frequency of Social Gatherings with Friends and Family: More than three times a week    Attends Religious Services: Never    Marine scientist or Organizations: No    Attends Archivist Meetings: Never    Marital Status: Living with partner  Intimate Partner Violence: Not At Risk (04/07/2022)   Humiliation, Afraid, Rape, and Kick questionnaire    Fear of Current or Ex-Partner: No    Emotionally Abused: No    Physically Abused:  No    Sexually Abused: No    Review of Systems: ROS negative except for what is noted on the assessment and plan.  Vitals:   04/07/22 1320  BP: 130/69  Pulse: 98  Temp: 97.6 F (36.4 C)  TempSrc: Oral  SpO2: 100%  Weight: 198 lb 8 oz (90 kg)  Height: 5\' 5"  (1.651 m)    Physical Exam: Constitutional: well-appearing young female sitting in chair, in no acute distress HENT: mucous membranes moist, tonsils symmetric, no abscess but there is a small white plaque on R tonsil; no thyroid enlargement/tenderness  Cardiovascular: regular rate and rhythm, no m/r/g Pulmonary/Chest: normal work of breathing on room air, lungs clear to auscultation bilaterally Neurological: alert & oriented x 3, no focal deficit Skin: warm and dry Psych: normal mood and behavior  Assessment & Plan:   Patient discussed with Dr. Evette Doffing  Tonsillitis The patient has a history of recurrent tonsillitis -she has had 3 episodes in the past 3 months.  Each time, her symptoms have improved after a course of amoxicillin, but her symptoms recur shortly after. Strep testing has been negative in the past, as well as acute EBV (she does have IgG Abs to EBV, but no IgM). COVID, flu, RSV also have all been negative.  She saw an ENT doctor today, who advised against surgery because she has not had 5-6 recurrent infections yet.   The patient states that she does have a sore throat now and feels like another infection may be starting again. She denies any fevers, chills, muffled voice, neck pain, or dysphagia.  Assessment: I wonder if the patient has an atypical cause of pharyngitis/tonsillitis, that has not been appropriately treated yet.  A viral illness is less likely, given that flu, COVID, and RSV testing have been negative, but acute HIV has not been ruled out.  As far as bacterial etiologies, group A strep has been negative, but group C or G strep have not been ruled out, as well as gonorrhea, chlamydia, or syphilis (the  patient denies any exposures to STIs, stating that she is monogamous with her boyfriend and he is monogamous as well).  TB, tularemia, and Yersinia are less likely given that the patient does not have lymphadenopathy.  Diphtheria is also less likely given that the patient does not have a cough.  Thyroiditis is less likely given that the patient has no neck pain and a normal thyroid exam, although the patient does have a history of hypothyroidism).  Environmental factors such as smoke, air pollution, and occupational exposures are also less likely given that the patient does not endorse any fume exposures (she works as a Chief Executive Officer).  She does vape about once a day though, which could  be an exposure.  Plan: - HIV - RPR - Respiratory culture (unfortunately does not check for group C or G strep) - Gonorrhea chlamydia (oral swab) - Consider second opinion ENT if workup negative and patient continues to have recurrent episodes   Terran Klinke, D.O. [facility], PGY-2 Phone: [phone] Date 04/07/2022 Time 4:34 PM

## 2022-04-07 NOTE — Patient Instructions (Signed)
Thank you, Ms.Ashley Petersen for allowing [place] to provide your care today. Today we discussed:  Tonsillitis: We swabbed your throat today and did some blood work to check for other causes of tonsillitis. Hopefully we can find the cause of it, and then can treat you properly before you need to have any surgery.   I have ordered the following labs for you:   Lab Orders         Aerobic Culture         RPR         HIV antibody (with reflex)       Referrals ordered today:   Referral Orders  No referral(s) requested today     I have ordered the following medication/changed the following medications:   Stop the following medications: There are no discontinued medications.   Start the following medications: No orders of the defined types were placed in this encounter.    Follow up:  if symptoms worsen or fail to improve      Should you have any questions or concerns please call the internal medicine clinic at [phone].     Buddy Duty, D.O. [place]

## 2022-04-08 LAB — RPR: RPR Ser Ql: NONREACTIVE

## 2022-04-08 LAB — HIV ANTIBODY (ROUTINE TESTING W REFLEX): HIV Screen 4th Generation wRfx: NONREACTIVE

## 2022-04-08 LAB — CYTOLOGY, (ORAL, ANAL, URETHRAL) ANCILLARY ONLY
Chlamydia: NEGATIVE
Comment: NEGATIVE
Comment: NORMAL
Neisseria Gonorrhea: NEGATIVE

## 2022-04-08 NOTE — Progress Notes (Signed)
Internal Medicine Clinic Attending ° °Case discussed with Dr. Atway  At the time of the visit.  We reviewed the resident’s history and exam and pertinent patient test results.  I agree with the assessment, diagnosis, and plan of care documented in the resident’s note.  °

## 2022-04-10 LAB — UPPER RESPIRATORY CULTURE, ROUTINE

## 2022-04-11 NOTE — Progress Notes (Signed)
Workup for pharyngitis/tonsillitis negative.  Called patient and and sent patient's PCP a message regarding results.  Also advised patient to send [place] another picture of her tonsils/throat at the next flareup.

## 2022-05-24 ENCOUNTER — Other Ambulatory Visit: Payer: Self-pay

## 2022-05-27 ENCOUNTER — Ambulatory Visit: Payer: 59 | Admitting: Internal Medicine

## 2022-05-27 ENCOUNTER — Encounter: Payer: Self-pay | Admitting: Internal Medicine

## 2022-05-27 VITALS — BP 130/76 | HR 88 | Ht 65.0 in | Wt 199.0 lb

## 2022-05-27 DIAGNOSIS — E0590 Thyrotoxicosis, unspecified without thyrotoxic crisis or storm: Secondary | ICD-10-CM | POA: Diagnosis not present

## 2022-05-27 LAB — TSH: TSH: 0.54 u[IU]/mL (ref 0.35–5.50)

## 2022-05-27 LAB — T4, FREE: Free T4: 0.7 ng/dL (ref 0.60–1.60)

## 2022-05-27 NOTE — Progress Notes (Signed)
Name: Ashley Petersen  MRN/ DOB: [id number], 08-Jan-2001    Age/ Sex: 22 y.o., female    PCP: Rema Fendt, NP   Reason for Endocrinology Evaluation: Subclinical Hyperthyroidism     Date of Initial Endocrinology Evaluation: 09/01/2021    HPI: [name] is a 22 y.o. female with a past medical history of Subclinical Hyperthyroidism. The patient presented for initial endocrinology clinic visit on 09/01/2021 for consultative assistance with her Subclinical Hyperthyroidism .   She has been diagnosed with subclinical hyperthyroidism in 12/2020 with a TSH nadir of 0.210 uIU/ml and normal FT4 and T3.  On her initial visit to our clinic she had a TSH of 0.32 uIU/mL with normal free T4 and T3, but we opted to start on methimazole due to symptoms of tremors, palpitations, and shortness of breath Patient was started on methimazole 08/2021    TRAB was not elevated but it was detectable at 1.79 IU/L  No family history of thyroid disease  SUBJECTIVE:    Today (05/27/22):  [name] is here for a follow up on hyperthyroidism.  She was evaluated by ENT for 2 episodes of nonstop tonsillitis, no need for surgical intervention Weight has been trending up  Denies heartburn  Denies palpitation  Mild  tremors  Denies diarrhea  Denies local neck swelling  Denies anxiety Hair is thinning    Methimazole 5 mg, half a tablet daily     HISTORY:  Past Medical History:  Past Medical History:  Diagnosis Date   Asthma    Hyperthyroidism 01/2022   Past Surgical History: No past surgical history on file.  Social History:  reports that she has never smoked. She has never been exposed to tobacco smoke. She has never used smokeless tobacco. She reports that she does not drink alcohol and does not use drugs. Family History: family history includes Diabetes in her mother; Hypertension in her father.   HOME MEDICATIONS: Allergies as of 05/27/2022       Reactions   Pecan Nut (diagnostic)  Hives, Itching, Shortness Of Breath, Swelling   Tree Extract Anaphylaxis   From an allergy test From an allergy test   Peanut-containing Drug Products Swelling, Other (See Comments)        Medication List        Accurate as of May 27, 2022  7:59 AM. If you have any questions, ask your nurse or doctor.          albuterol 108 (90 Base) MCG/ACT inhaler Commonly known as: VENTOLIN HFA Inhale 1-2 puffs into the lungs every 6 (six) hours as needed for wheezing or shortness of breath.   beclomethasone 40 MCG/ACT inhaler Commonly known as: QVAR Inhale 2 puffs into the lungs 2 (two) times daily.   cetirizine 10 MG tablet Commonly known as: ZYRTEC Take 1 tablet (10 mg total) by mouth daily.   EPINEPHrine 0.3 mg/0.3 mL Soaj injection Commonly known as: EpiPen 2-Pak Inject 0.3 mg into the muscle as needed for anaphylaxis.   fluticasone 50 MCG/ACT nasal spray Commonly known as: FLONASE Place 1 spray into both nostrils daily.   methimazole 5 MG tablet Commonly known as: TAPAZOLE Take 0.5 tablets (2.5 mg total) by mouth daily.   naproxen 500 MG tablet Commonly known as: NAPROSYN Take 1 tablet (500 mg total) by mouth daily as needed.          REVIEW OF SYSTEMS: A comprehensive ROS was conducted with the patient and is negative except as per HPI  OBJECTIVE:  VS: BP 130/76 (BP Location: Left Arm, Patient Position: Sitting, Cuff Size: Small)   Pulse 88   Ht 5\' 5"  (1.651 m)   Wt 199 lb (90.3 kg)   SpO2 97%   BMI 33.12 kg/m    Wt Readings from Last 3 Encounters:  05/27/22 199 lb (90.3 kg)  04/07/22 198 lb 8 oz (90 kg)  01/20/22 194 lb (88 kg)     EXAM: General: Pt appears well and is in NAD  Eyes: External eye exam normal without stare, lid lag or exophthalmos.  EOM intact.    Neck: General: Supple without adenopathy. Thyroid: Thyroid size normal.  No goiter or nodules appreciated. No thyroid bruit.  Lungs: Clear with good BS bilat   Heart: Auscultation:  RRR.  Abdomen: Normoactive bowel sounds, soft, nontender, without masses or organomegaly palpable  Extremities:  BL LE: No pretibial edema  Mental Status: Judgment, insight: Intact Orientation: Oriented to time, place, and person Mood and affect: No depression, anxiety, or agitation     DATA REVIEWED:   Latest Reference Range & Units 05/27/22 08:15  TSH 0.35 - 5.50 uIU/mL 0.54  Triiodothyronine (T3) 76 - 181 ng/dL 161  W9,UEAV(WUJWJX) 9.14 - 1.60 ng/dL 7.82        Latest Reference Range & Units 12/30/20 13:21  Potassium 3.5 - 5.2 mmol/L 4.1  Chloride 96 - 106 mmol/L 100  CO2 20 - 29 mmol/L 24  Glucose 70 - 99 mg/dL 79  BUN 6 - 20 mg/dL 8  Creatinine 9.56 - 2.13 mg/dL 0.86  Calcium 8.7 - 57.8 mg/dL 9.9  BUN/Creatinine Ratio 9 - 23  13  eGFR >59 mL/min/1.73 131  Alkaline Phosphatase 44 - 121 IU/L 58  Albumin 3.9 - 5.0 g/dL 4.7  Albumin/Globulin Ratio 1.2 - 2.2  1.8  AST 0 - 40 IU/L 13  ALT 0 - 32 IU/L 9  Total Protein 6.0 - 8.5 g/dL 7.3  Total Bilirubin 0.0 - 1.2 mg/dL 0.5    Latest Reference Range & Units 12/30/20 13:21  WBC 3.4 - 10.8 x10E3/uL 4.2  RBC 3.77 - 5.28 x10E6/uL 5.36 (H)  Hemoglobin 11.1 - 15.9 g/dL 46.9 (L)  HCT 62.9 - 52.8 % 36.6  MCV 79 - 97 fL 68 (L)  MCH 26.6 - 33.0 pg 20.5 (L)  MCHC 31.5 - 35.7 g/dL 41.3 (L)  RDW 24.4 - 01.0 % 17.3 (H)  Platelets 150 - 450 x10E3/uL 325     Latest Reference Range & Units 09/01/21 08:59  TRAB <=2.00 IU/L 1.79     ASSESSMENT/PLAN/RECOMMENDATIONS:   Subclinical Hyperthyroidism:  - Pt with weight gain and hair thinning, discussed low carb diet and exercise  - pt may use Biotin but will have to hold it 2-3 days prior to any future thyroid testing  - Counseled about birthcontrol and avoiding pregnancy on Methimazole and to notify us with the intent to conceive to switch to PTU  - High suspicion for Graves' disease, TRAB  detectable but not elevated -TFTs remain within normal range, will reduce methimazole as  below   Medications : Decrease methimazole 5 mg, half a tablet Monday through Saturday, none on Sundays   Signed electronically by: Lyndle Herrlich, MD  [facility] Endocrinology  [facility] [address] [place], [place] [address] Phone: [phone] FAX: [phone]   CC: Rema Fendt, NP [address] [place] [address] Phone: [phone] Fax: [phone]   Return to Endocrinology clinic as below:  No future appointments.

## 2022-05-27 NOTE — Patient Instructions (Signed)
If you start Biotin hold it 2-3 days before any future thyroid blood work

## 2022-05-28 LAB — T3: T3, Total: 114 ng/dL (ref 76–181)

## 2022-05-30 MED ORDER — METHIMAZOLE 5 MG PO TABS: 2.5000 mg | ORAL_TABLET | ORAL | 1 refills | Status: DC

## 2022-06-02 ENCOUNTER — Other Ambulatory Visit: Payer: Self-pay

## 2022-06-03 ENCOUNTER — Other Ambulatory Visit: Payer: Self-pay

## 2022-10-21 ENCOUNTER — Other Ambulatory Visit: Payer: Self-pay

## 2022-11-29 ENCOUNTER — Encounter: Payer: Self-pay | Admitting: Internal Medicine

## 2022-11-29 ENCOUNTER — Ambulatory Visit: Payer: 59 | Admitting: Internal Medicine

## 2022-11-29 VITALS — BP 120/80 | HR 84 | Ht 65.0 in | Wt 200.6 lb

## 2022-11-29 DIAGNOSIS — E0590 Thyrotoxicosis, unspecified without thyrotoxic crisis or storm: Secondary | ICD-10-CM | POA: Diagnosis not present

## 2022-11-29 LAB — T4, FREE: Free T4: 0.64 ng/dL (ref 0.60–1.60)

## 2022-11-29 LAB — TSH: TSH: 0.7 u[IU]/mL (ref 0.35–5.50)

## 2022-11-29 NOTE — Progress Notes (Signed)
Name: Ashley Petersen  MRN/ DOB: [id number], [birth year]    Age/ Sex: 22 y.o., female    PCP: Rema Fendt, NP   Reason for Endocrinology Evaluation: Subclinical Hyperthyroidism     Date of Initial Endocrinology Evaluation: 09/01/2021    HPI: [name] is a 22 y.o. female with a past medical history of Subclinical Hyperthyroidism. The patient presented for initial endocrinology clinic visit on 09/01/2021 for consultative assistance with her Subclinical Hyperthyroidism .   She has been diagnosed with subclinical hyperthyroidism in 12/2020 with a TSH nadir of 0.210 uIU/ml and normal FT4 and T3.  On her initial visit to our clinic she had a TSH of 0.32 uIU/mL with normal free T4 and T3, but we opted to start on methimazole due to symptoms of tremors, palpitations, and shortness of breath Patient was started on methimazole 08/2021    TRAB was not elevated but it was detectable at 1.79 IU/L  No family history of thyroid disease  SUBJECTIVE:    Today (11/29/22):  [name] is here for a follow up on hyperthyroidism.  Weight has been stable  Denies palpitation  Denies tremors  Denies diarrhea or constipation   Denies local neck swelling  Has occasional eye itching, no double vision or vision changes   Methimazole 5 mg, half a tablet Monday through Saturday, none on Sundays    HISTORY:  Past Medical History:  Past Medical History:  Diagnosis Date   Asthma    Hyperthyroidism 01/2022   Past Surgical History: No past surgical history on file.  Social History:  reports that she has never smoked. She has never been exposed to tobacco smoke. She has never used smokeless tobacco. She reports that she does not drink alcohol and does not use drugs. Family History: family history includes Diabetes in her mother; Hypertension in her father.   HOME MEDICATIONS: Allergies as of 11/29/2022       Reactions   Pecan Nut (diagnostic) Hives, Itching, Shortness Of Breath,  Swelling   Tree Extract Anaphylaxis   From an allergy test From an allergy test   Peanut-containing Drug Products Swelling, Other (See Comments)        Medication List        Accurate as of November 29, 2022  8:01 AM. If you have any questions, ask your nurse or doctor.          STOP taking these medications    cetirizine 10 MG tablet Commonly known as: ZYRTEC Stopped by: Johnney Ou Luara Faye   fluticasone 50 MCG/ACT nasal spray Commonly known as: FLONASE Stopped by: Johnney Ou Nancey Kreitz       TAKE these medications    albuterol 108 (90 Base) MCG/ACT inhaler Commonly known as: VENTOLIN HFA Inhale 1-2 puffs into the lungs every 6 (six) hours as needed for wheezing or shortness of breath.   beclomethasone 40 MCG/ACT inhaler Commonly known as: QVAR Inhale 2 puffs into the lungs 2 (two) times daily.   EPINEPHrine 0.3 mg/0.3 mL Soaj injection Commonly known as: EpiPen 2-Pak Inject 0.3 mg into the muscle as needed for anaphylaxis.   methimazole 5 MG tablet Commonly known as: TAPAZOLE Take 0.5 tablets (2.5 mg total) by mouth as directed. Half a tablet Monday through Saturday, none on Sundays   naproxen 500 MG tablet Commonly known as: NAPROSYN Take 1 tablet (500 mg total) by mouth daily as needed.          REVIEW OF SYSTEMS: A comprehensive ROS was  conducted with the patient and is negative except as per HPI     OBJECTIVE:  VS: BP 120/80 (BP Location: Left Arm, Patient Position: Sitting, Cuff Size: Large)   Pulse 84   Ht 5\' 5"  (1.651 m)   Wt 200 lb 9.6 oz (91 kg)   SpO2 96%   BMI 33.38 kg/m    Wt Readings from Last 3 Encounters:  11/29/22 200 lb 9.6 oz (91 kg)  05/27/22 199 lb (90.3 kg)  04/07/22 198 lb 8 oz (90 kg)     EXAM: General: Pt appears well and is in NAD  Neck: General: Supple without adenopathy. Thyroid: Thyroid size normal.  No goiter or nodules appreciated. No thyroid bruit.  Lungs: Clear with good BS bilat   Heart:  Auscultation: RRR.  Abdomen: Soft, nontender  Extremities:  BL LE: No pretibial edema  Mental Status: Judgment, insight: Intact Orientation: Oriented to time, place, and person Mood and affect: No depression, anxiety, or agitation     DATA REVIEWED:   Latest Reference Range & Units 11/29/22 08:15  TSH 0.35 - 5.50 uIU/mL 0.70  T4,Free(Direct) 0.60 - 1.60 ng/dL 1.91     ASSESSMENT/PLAN/RECOMMENDATIONS:   Subclinical Hyperthyroidism:  -Patient is clinically euthyroid -Clinical scenario consistent with Graves' disease, TRAB  detectable but not elevated -TFTs remain normal, will decrease methimazole as below  Medications : Decrease Methimazole 5 mg, half a tablet Monday through Friday , none on Saturday or Sundays  Follow-up in 6 months  Signed electronically by: Lyndle Herrlich, MD  [facility] Endocrinology  [facility] [address] [place] [place], [place] [address] Phone: [phone] FAX: [phone]   CC: Rema Fendt, NP [address] [place] [address] Phone: [phone] Fax: [phone]   Return to Endocrinology clinic as below: No future appointments.

## 2022-12-21 ENCOUNTER — Encounter: Payer: Self-pay | Admitting: Family

## 2022-12-23 ENCOUNTER — Telehealth: Payer: 59 | Admitting: Family Medicine

## 2022-12-23 DIAGNOSIS — J4540 Moderate persistent asthma, uncomplicated: Secondary | ICD-10-CM | POA: Diagnosis not present

## 2022-12-23 DIAGNOSIS — U071 COVID-19: Secondary | ICD-10-CM | POA: Diagnosis not present

## 2022-12-23 MED ORDER — PROMETHAZINE-DM 6.25-15 MG/5ML PO SYRP: 5.0000 mL | ORAL_SOLUTION | Freq: Four times a day (QID) | ORAL | 0 refills | Status: AC | PRN

## 2022-12-23 MED ORDER — QVAR REDIHALER 40 MCG/ACT IN AERB: 1.0000 | INHALATION_SPRAY | Freq: Two times a day (BID) | RESPIRATORY_TRACT | 0 refills | Status: AC

## 2022-12-23 MED ORDER — PREDNISONE 20 MG PO TABS: 20.0000 mg | ORAL_TABLET | Freq: Two times a day (BID) | ORAL | 0 refills | Status: AC

## 2022-12-23 MED ORDER — ALBUTEROL SULFATE HFA 108 (90 BASE) MCG/ACT IN AERS: 2.0000 | INHALATION_SPRAY | Freq: Four times a day (QID) | RESPIRATORY_TRACT | 0 refills | Status: DC | PRN

## 2022-12-23 NOTE — Patient Instructions (Signed)
Asthma, Adult  Asthma is a long-term (chronic) condition that causes recurrent episodes in which the lower airways in the lungs become tight and narrow. The narrowing is caused by inflammation and tightening of the smooth muscle around the lower airways. Asthma episodes, also called asthma attacks or asthma flares, may cause coughing, making high-pitched whistling sounds when you breathe, most often when you breathe out (wheezing), shortness of breath, and chest pain. The airways may produce extra mucus caused by the inflammation and irritation. During an attack, it can be difficult to breathe. Asthma attacks can range from minor to life-threatening. Asthma cannot be cured, but medicines and lifestyle changes can help control it and treat acute attacks. It is important to keep your asthma well controlled so the condition does not interfere with your daily life. What are the causes? This condition is believed to be caused by inherited (genetic) and environmental factors, but its exact cause is not known. What can trigger an asthma attack? Many things can bring on an asthma attack or make symptoms worse. These triggers are different for every person. Common triggers include: Allergens and irritants like mold, dust, pet dander, cockroaches, pollen, air pollution, and chemical odors. Cigarette smoke. Weather changes and cold air. Stress and strong emotional responses such as crying or laughing hard. Certain medications such as aspirin or beta blockers. Infections and inflammatory conditions, such as the flu, a cold, pneumonia, or inflammation of the nasal membranes (rhinitis). Gastroesophageal reflux disease (GERD). What are the signs or symptoms? Symptoms may occur right after exposure to an asthma trigger or hours later and can vary by person. Common signs and symptoms include: Wheezing. Trouble breathing (shortness of breath). Excessive nighttime or early morning coughing. Chest  tightness. Tiredness (fatigue) with minimal activity. Difficulty talking in complete sentences. Poor exercise tolerance. How is this diagnosed? This condition is diagnosed based on: A physical exam and your medical history. Tests, which may include: Lung function studies to evaluate the flow of air in your lungs. Allergy tests. Imaging tests, such as X-rays. How is this treated? There is no cure, but symptoms can be controlled with proper treatment. Treatment usually involves: Identifying and avoiding your asthma triggers. Inhaled medicines. Two types are commonly used to treat asthma, depending on severity: Controller medicines. These help prevent asthma symptoms from occurring. They are taken every day. Fast-acting reliever or rescue medicines. These quickly relieve asthma symptoms. They are used as needed and provide short-term relief. Using other medicines, such as: Allergy medicines, such as antihistamines, if your asthma attacks are triggered by allergens. Immune medicines (immunomodulators). These are medicines that help control the immune system. Using supplemental oxygen. This is only needed during a severe episode. Creating an asthma action plan. An asthma action plan is a written plan for managing and treating your asthma attacks. This plan includes: A list of your asthma triggers and how to avoid them. Information about when medicines should be taken and when their dosage should be changed. Instructions about using a device called a peak flow meter. A peak flow meter measures how well the lungs are working and the severity of your asthma. It helps you monitor your condition. Follow these instructions at home: Take over-the-counter and prescription medicines only as told by your health care provider. Stay up to date on all vaccinations as recommended by your healthcare provider, including vaccines for the flu and pneumonia. Use a peak flow meter and keep track of your peak flow  readings. Understand and use your asthma  action plan to address any asthma flares. Do not smoke or allow anyone to smoke in your home. Contact a health care provider if: You have wheezing, shortness of breath, or a cough that is not responding to medicines. Your medicines are causing side effects, such as a rash, itching, swelling, or trouble breathing. You need to use a reliever medicine more than 2-3 times a week. Your peak flow reading is still at 50-79% of your personal best after following your action plan for 1 hour. You have a fever and shortness of breath. Get help right away if: You are getting worse and do not respond to treatment during an asthma attack. You are short of breath when at rest or when doing very little physical activity. You have difficulty eating, drinking, or talking. You have chest pain or tightness. You develop a fast heartbeat or palpitations. You have a bluish color to your lips or fingernails. You are light-headed or dizzy, or you faint. Your peak flow reading is less than 50% of your personal best. You feel too tired to breathe normally. These symptoms may be an emergency. Get help right away. Call 911. Do not wait to see if the symptoms will go away. Do not drive yourself to the hospital. Summary Asthma is a long-term (chronic) condition that causes recurrent episodes in which the airways become tight and narrow. Asthma episodes, also called asthma attacks or asthma flares, can cause coughing, wheezing, shortness of breath, and chest pain. Asthma cannot be cured, but medicines and lifestyle changes can help keep it well controlled and prevent asthma flares. Make sure you understand how to avoid triggers and how and when to use your medicines. Asthma attacks can range from minor to life-threatening. Get help right away if you have an asthma attack and do not respond to treatment with your usual rescue medicines. This information is not intended to replace  advice given to you by your health care provider. Make sure you discuss any questions you have with your health care provider. Document Revised: 10/21/2020 Document Reviewed: 10/12/2020 Elsevier Patient Education  2024 Elsevier Inc. COVID-19 COVID-19 is an infection caused by a virus called SARS-CoV-2. This type of virus is called a coronavirus. People with COVID-19 may: Have little to no symptoms. Have mild to moderate symptoms that affect their lungs and breathing. Get very sick. What are the causes? COVID-19 is caused by a virus. This virus may be in the air as droplets or on surfaces. It can spread from an infected person when they cough, sneeze, speak, sing, or breathe. You may become infected if: You breathe in the infected droplets in the air. You touch an object that has the virus on it. What increases the risk? You are at risk of getting COVID-19 if you have been around someone with the infection. You may be more likely to get very sick if: You are 39 years old or older. You have certain medical conditions, such as: Heart disease. Diabetes. Chronic respiratory disease. Cancer. Pregnancy. You are immunocompromised. This means your body cannot fight infections easily. You have a disability or trouble moving, meaning you're immobile. What are the signs or symptoms? People may have different symptoms from COVID-19. The symptoms can also be mild to severe. They often show up in 5-6 days after being infected. But they can take up to 14 days to appear. Common symptoms are: Cough. Feeling tired. New loss of taste or smell. Fever. Less common symptoms are: Sore throat. Headache. Body or  muscle aches. Diarrhea. A skin rash or odd-colored fingers or toes. Red or irritated eyes. Sometimes, COVID-19 does not cause symptoms. How is this diagnosed? COVID-19 can be diagnosed with tests done in the lab or at home. Fluid from your nose, mouth, or lungs will be used to check for the  virus. How is this treated? Treatment for COVID-19 depends on how sick you are. Mild symptoms can be treated at home with rest, fluids, and over-the-counter medicines. Severe symptoms may be treated in a hospital intensive care unit (ICU). If you have symptoms and are at risk of getting very sick, you may be given a medicine that fights viruses. This medicine is called an antiviral. How is this prevented? To protect yourself from COVID-19: Know your risk factors. Get vaccinated. If your body cannot fight infections easily, talk to your provider about treatment to help prevent COVID-19. Stay at least 1 meter away from others. Wear a well-fitted mask when: You can't stay at a distance from people. You're in a place with poor air flow. Try to be in open spaces with good air flow when in public. Wash your hands often or use an alcohol-based hand sanitizer. Cover your nose and mouth when coughing and sneezing. If you think you have COVID-19 or have been around someone who has it, stay home and be by yourself for 5-10 days. Where to find more information Centers for Disease Control and Prevention (CDC): [employer]erProm[employer].no World Health Organization [facility]): VisitDestination.com.br Get help right away if: You have trouble breathing or get short of breath. You have pain or pressure in your chest. You cannot speak or move any part of your body. You are confused. Your symptoms get worse. These symptoms may be an emergency. Get help right away. Call 911. Do not wait to see if the symptoms will go away. Do not drive yourself to the hospital. This information is not intended to replace advice given to you by your health care provider. Make sure you discuss any questions you have with your health care provider. Document Revised: 01/11/2022 Document Reviewed: 09/17/2021 Elsevier Patient Education  2024 [employer].

## 2022-12-23 NOTE — Progress Notes (Signed)
Virtual Visit Consent   Ashley Petersen, you are scheduled for a virtual visit with a [facility] provider today. Just as with appointments in the office, your consent must be obtained to participate. Your consent will be active for this visit and any virtual visit you may have with one of our providers in the next 365 days. If you have a MyChart account, a copy of this consent can be sent to you electronically.  As this is a virtual visit, video technology does not allow for your provider to perform a traditional examination. This may limit your provider's ability to fully assess your condition. If your provider identifies any concerns that need to be evaluated in person or the need to arrange testing (such as labs, EKG, etc.), we will make arrangements to do so. Although advances in technology are sophisticated, we cannot ensure that it will always work on either your end or our end. If the connection with a video visit is poor, the visit may have to be switched to a telephone visit. With either a video or telephone visit, we are not always able to ensure that we have a secure connection.  By engaging in this virtual visit, you consent to the provision of healthcare and authorize for your insurance to be billed (if applicable) for the services provided during this visit. Depending on your insurance coverage, you may receive a charge related to this service.  I need to obtain your verbal consent now. Are you willing to proceed with your visit today? Ashley Petersen has provided verbal consent on 12/23/2022 for a virtual visit (video or telephone). Ashley Curio, FNP  Date: 12/23/2022 9:17 AM  Virtual Visit via Video Note   I, Ashley Petersen, connected with  Ashley Petersen  ([id number], 2000/03/15) on 12/23/22 at  9:15 AM EST by a video-enabled telemedicine application and verified that I am speaking with the correct person using two identifiers.  Location: Patient: Virtual Visit Location Patient:  Home Provider: Virtual Visit Location Provider: Home Office   I discussed the limitations of evaluation and management by telemedicine and the availability of in person appointments. The patient expressed understanding and agreed to proceed.    History of Present Illness: Ashley Petersen is a 22 y.o. who identifies as a female who was assigned female at birth, and is being seen today for covid positive testing on Tuesday with sx starting 12/17/22. Seen at outside office. Her asthma is now flaring.She needs refills on inhalers. They cough is keeping her up.   HPI: HPI  Problems:  Patient Active Problem List   Diagnosis Date Noted   Tonsillitis 04/07/2022   Bacterial vaginitis 01/01/2021   Candida vaginitis 01/01/2021   Asthma 12/30/2020   H/O renal calculi 12/18/2013    Allergies:  Allergies  Allergen Reactions   Pecan Nut (Diagnostic) Hives, Itching, Shortness Of Breath and Swelling   Tree Extract Anaphylaxis    From an allergy test From an allergy test    Peanut-Containing Drug Products Swelling and Other (See Comments)   Medications:  Current Outpatient Medications:    albuterol (VENTOLIN HFA) 108 (90 Base) MCG/ACT inhaler, Inhale 2 puffs into the lungs every 6 (six) hours as needed for wheezing or shortness of breath., Disp: 8 g, Rfl: 0   beclomethasone (QVAR REDIHALER) 40 MCG/ACT inhaler, Inhale 1 puff into the lungs 2 (two) times daily., Disp: 1 each, Rfl: 0   predniSONE (DELTASONE) 20 MG tablet, Take 1 tablet (20 mg total) by mouth 2 (two) times  daily with a meal for 5 days., Disp: 10 tablet, Rfl: 0   promethazine-dextromethorphan (PROMETHAZINE-DM) 6.25-15 MG/5ML syrup, Take 5 mLs by mouth 4 (four) times daily as needed for up to 10 days for cough., Disp: 118 mL, Rfl: 0   albuterol (VENTOLIN HFA) 108 (90 Base) MCG/ACT inhaler, Inhale 1-2 puffs into the lungs every 6 (six) hours as needed for wheezing or shortness of breath., Disp: 18 g, Rfl: 1   beclomethasone (QVAR) 40 MCG/ACT  inhaler, Inhale 2 puffs into the lungs 2 (two) times daily., Disp: 1 each, Rfl: 2   EPINEPHrine (EPIPEN 2-PAK) 0.3 mg/0.3 mL IJ SOAJ injection, Inject 0.3 mg into the muscle as needed for anaphylaxis., Disp: 2 each, Rfl: 1   methimazole (TAPAZOLE) 5 MG tablet, Take 0.5 tablets (2.5 mg total) by mouth as directed. Half a tablet Monday through Saturday, none on Sundays, Disp: 36 tablet, Rfl: 1   naproxen (NAPROSYN) 500 MG tablet, Take 1 tablet (500 mg total) by mouth daily as needed., Disp: 30 tablet, Rfl: 2  Observations/Objective: Patient is well-developed, well-nourished in no acute distress.  Resting comfortably  at home.  Head is normocephalic, atraumatic.  No labored breathing.  Speech is clear and coherent with logical content.  Patient is alert and oriented at baseline.    Assessment and Plan: 1. COVID  2. Moderate persistent asthma without complication  Increase fluids, MVI with vit d and zinc, rest, tylenol or ibuprofen as directed, uc if sx worsen.   Follow Up Instructions: I discussed the assessment and treatment plan with the patient. The patient was provided an opportunity to ask questions and all were answered. The patient agreed with the plan and demonstrated an understanding of the instructions.  A copy of instructions were sent to the patient via MyChart unless otherwise noted below.     The patient was advised to call back or seek an in-person evaluation if the symptoms worsen or if the condition fails to improve as anticipated.    Ashley Curio, FNP

## 2023-01-05 ENCOUNTER — Other Ambulatory Visit: Payer: Self-pay | Admitting: Family

## 2023-01-05 DIAGNOSIS — Z1231 Encounter for screening mammogram for malignant neoplasm of breast: Secondary | ICD-10-CM

## 2023-01-10 ENCOUNTER — Other Ambulatory Visit: Payer: Self-pay | Admitting: Internal Medicine

## 2023-01-12 NOTE — Telephone Encounter (Signed)
 Methimazole refill request complete

## 2023-01-25 ENCOUNTER — Encounter: Payer: 59 | Admitting: Family

## 2023-01-27 DIAGNOSIS — Z1231 Encounter for screening mammogram for malignant neoplasm of breast: Secondary | ICD-10-CM

## 2023-03-07 ENCOUNTER — Encounter: Payer: 59 | Admitting: Family

## 2023-05-30 ENCOUNTER — Ambulatory Visit: Payer: 59 | Admitting: Internal Medicine

## 2023-05-31 ENCOUNTER — Ambulatory Visit: Payer: 59 | Admitting: Internal Medicine

## 2023-06-05 ENCOUNTER — Ambulatory Visit: Admitting: Internal Medicine

## 2023-06-05 VITALS — BP 122/70 | HR 75 | Ht 65.0 in | Wt 218.0 lb

## 2023-06-05 DIAGNOSIS — E0590 Thyrotoxicosis, unspecified without thyrotoxic crisis or storm: Secondary | ICD-10-CM | POA: Diagnosis not present

## 2023-06-05 LAB — TSH: TSH: 0.61 m[IU]/L

## 2023-06-05 LAB — T4, FREE: Free T4: 1 ng/dL (ref 0.8–1.8)

## 2023-06-05 NOTE — Progress Notes (Signed)
 Name: Ashley Petersen  MRN/ DOB: [id number], 27-May-2000    Age/ Sex: 23 y.o., female    PCP: Senaida Dama, NP   Reason for Endocrinology Evaluation: Subclinical Hyperthyroidism     Date of Initial Endocrinology Evaluation: 09/01/2021    HPI: Ashley Petersen is a 23 y.o. female with a past medical history of Subclinical Hyperthyroidism. The patient presented for initial endocrinology clinic visit on 09/01/2021 for consultative assistance with her Subclinical Hyperthyroidism .   She has been diagnosed with subclinical hyperthyroidism in 12/2020 with a TSH nadir of 0.210 uIU/ml and normal FT4 and T3.  On her initial visit to our clinic she had a TSH of 0.32 uIU/mL with normal free T4 and T3, but we opted to start on methimazole  due to symptoms of tremors, palpitations, and shortness of breath Patient was started on methimazole  08/2021    TRAB was not elevated but it was detectable at 1.79 IU/L  No family history of thyroid  disease  SUBJECTIVE:    Today (06/05/23):  Ashley Petersen is here for a follow up on hyperthyroidism.  Pt has been noted with weight gain  Has noted transient swelling and tenderness on the right side ~ 3 weeks ago, associated with sore throat Has mild  palpitation  Denies tremors  Denies diarrhea or constipation   Has burning and pruritus of the eyes, last eye exam was ~ 1 yr ago   Methimazole  5 mg, half a tablet Monday through Friday only    HISTORY:  Past Medical History:  Past Medical History:  Diagnosis Date   Asthma    Hyperthyroidism 01/2022   Past Surgical History: No past surgical history on file.  Social History:  reports that she has never smoked. She has never been exposed to tobacco smoke. She has never used smokeless tobacco. She reports that she does not drink alcohol and does not use drugs. Family History: family history includes Diabetes in her mother; Hypertension in her father.   HOME MEDICATIONS: Allergies as of 06/05/2023        Reactions   Pecan Nut (diagnostic) Hives, Itching, Shortness Of Breath, Swelling   Tree Extract Anaphylaxis   From an allergy test From an allergy test   Peanut-containing Drug Products Swelling, Other (See Comments)        Medication List        Accurate as of Jun 05, 2023  6:40 AM. If you have any questions, ask your nurse or doctor.          albuterol  108 (90 Base) MCG/ACT inhaler Commonly known as: VENTOLIN  HFA Inhale 1-2 puffs into the lungs every 6 (six) hours as needed for wheezing or shortness of breath.   albuterol  108 (90 Base) MCG/ACT inhaler Commonly known as: VENTOLIN  HFA Inhale 2 puffs into the lungs every 6 (six) hours as needed for wheezing or shortness of breath.   beclomethasone 40 MCG/ACT inhaler Commonly known as: QVAR  Inhale 2 puffs into the lungs 2 (two) times daily.   EPINEPHrine  0.3 mg/0.3 mL Soaj injection Commonly known as: EpiPen  2-Pak Inject 0.3 mg into the muscle as needed for anaphylaxis.   methimazole  5 MG tablet Commonly known as: TAPAZOLE  TAKE 1/2 A TABLET BY MOUTH MONDAY THROUGH SATURDAY. NONE ON SUNDAYS   naproxen  500 MG tablet Commonly known as: NAPROSYN  Take 1 tablet (500 mg total) by mouth daily as needed.          REVIEW OF SYSTEMS: A comprehensive ROS was conducted with the  patient and is negative except as per HPI     OBJECTIVE:  VS:BP 122/70 (BP Location: Left Arm, Patient Position: Sitting, Cuff Size: Large)   Pulse 75   Ht 5\' 5"  (1.651 m)   Wt 218 lb (98.9 kg)   SpO2 99%   BMI 36.28 kg/m     Wt Readings from Last 3 Encounters:  11/29/22 200 lb 9.6 oz (91 kg)  05/27/22 199 lb (90.3 kg)  04/07/22 198 lb 8 oz (90 kg)     EXAM: General: Pt appears well and is in NAD  Neck: General: Supple without adenopathy. Thyroid : Thyroid  size normal.  No goiter or nodules appreciated.   Lungs: Clear with good BS bilat   Heart: Auscultation: RRR.  Abdomen: Soft, nontender  Extremities:  BL LE: No pretibial  edema  Mental Status: Judgment, insight: Intact Orientation: Oriented to time, place, and person Mood and affect: No depression, anxiety, or agitation     DATA REVIEWED:   Latest Reference Range & Units 06/05/23 08:05  TSH mIU/L 0.61  T4,Free(Direct) 0.8 - 1.8 ng/dL 1.0     ASSESSMENT/PLAN/RECOMMENDATIONS:   Subclinical Hyperthyroidism:  -Patient is clinically euthyroid -Clinical scenario consistent with Graves' disease, TRAB  detectable but not elevated -TFTs remain normal, no change   Medications : Continue methimazole  5 mg, half a tablet Monday through Friday , none on Saturday or Sundays  Follow-up in 6 months  Signed electronically by: Natale Bail, MD  [facility] Endocrinology  [facility] [address] [place] [place], [place] [address] Phone: [phone] FAX: [phone]   CC: Senaida Dama, NP [address] [place] [address] Phone: [phone] Fax: [phone]   Return to Endocrinology clinic as below: Future Appointments  Date Time Provider Department Center  06/05/2023  7:50 AM Wafaa Deemer, Julian Obey, MD [facility] None

## 2023-06-06 ENCOUNTER — Ambulatory Visit
Admission: RE | Admit: 2023-06-06 | Discharge: 2023-06-06 | Disposition: A | Discharge status: Home / Self Care | Source: Ambulatory Visit | Attending: Internal Medicine | Admitting: Internal Medicine

## 2023-06-06 ENCOUNTER — Ambulatory Visit: Payer: Self-pay | Admitting: Internal Medicine

## 2023-06-06 DIAGNOSIS — E0590 Thyrotoxicosis, unspecified without thyrotoxic crisis or storm: Secondary | ICD-10-CM

## 2023-06-06 MED ORDER — METHIMAZOLE 5 MG PO TABS: 2.5000 mg | ORAL_TABLET | ORAL | 2 refills | Status: DC

## 2023-06-17 ENCOUNTER — Other Ambulatory Visit: Payer: Self-pay | Admitting: Internal Medicine

## 2023-06-29 ENCOUNTER — Other Ambulatory Visit: Payer: Self-pay | Admitting: Family

## 2023-06-29 DIAGNOSIS — N946 Dysmenorrhea, unspecified: Secondary | ICD-10-CM

## 2023-06-30 ENCOUNTER — Other Ambulatory Visit: Payer: Self-pay

## 2023-06-30 ENCOUNTER — Other Ambulatory Visit: Payer: Self-pay | Admitting: Family

## 2023-06-30 DIAGNOSIS — N946 Dysmenorrhea, unspecified: Secondary | ICD-10-CM

## 2023-07-03 ENCOUNTER — Other Ambulatory Visit: Payer: Self-pay | Admitting: Family

## 2023-07-03 DIAGNOSIS — N946 Dysmenorrhea, unspecified: Secondary | ICD-10-CM

## 2023-07-04 ENCOUNTER — Other Ambulatory Visit: Payer: Self-pay

## 2023-07-06 ENCOUNTER — Other Ambulatory Visit: Payer: Self-pay

## 2023-07-06 ENCOUNTER — Other Ambulatory Visit: Payer: Self-pay | Admitting: Family

## 2023-07-06 DIAGNOSIS — N946 Dysmenorrhea, unspecified: Secondary | ICD-10-CM

## 2023-09-03 ENCOUNTER — Other Ambulatory Visit: Payer: Self-pay | Admitting: Family

## 2023-09-03 ENCOUNTER — Encounter: Payer: Self-pay | Admitting: Internal Medicine

## 2023-09-03 DIAGNOSIS — N946 Dysmenorrhea, unspecified: Secondary | ICD-10-CM

## 2023-09-04 ENCOUNTER — Other Ambulatory Visit: Payer: Self-pay

## 2023-09-04 MED ORDER — METHIMAZOLE 5 MG PO TABS: ORAL_TABLET | ORAL | 2 refills | Status: DC

## 2023-09-05 ENCOUNTER — Encounter: Payer: Self-pay | Admitting: Family

## 2023-09-05 ENCOUNTER — Other Ambulatory Visit: Payer: Self-pay

## 2023-09-06 ENCOUNTER — Telehealth: Payer: Self-pay | Admitting: *Deleted

## 2023-09-06 NOTE — Telephone Encounter (Signed)
 Schedule appointment. Last office visit 12/23/2021. During the interim report to Emergency Department/Urgent Care/call 911 for immediate medical evaluation.

## 2023-09-06 NOTE — Telephone Encounter (Signed)
 Patient request refill

## 2023-09-07 NOTE — Telephone Encounter (Signed)
 Pt scheduled for physical on 09/02

## 2023-09-19 ENCOUNTER — Other Ambulatory Visit (HOSPITAL_COMMUNITY)
Admission: RE | Admit: 2023-09-19 | Discharge: 2023-09-19 | Disposition: A | Discharge status: Home / Self Care | Source: Ambulatory Visit | Attending: Family | Admitting: Family

## 2023-09-19 ENCOUNTER — Ambulatory Visit (INDEPENDENT_AMBULATORY_CARE_PROVIDER_SITE_OTHER): Admitting: Family

## 2023-09-19 VITALS — BP 134/85 | HR 97 | Temp 99.0°F | Resp 16 | Ht 65.0 in | Wt 216.2 lb

## 2023-09-19 DIAGNOSIS — Z131 Encounter for screening for diabetes mellitus: Secondary | ICD-10-CM | POA: Diagnosis not present

## 2023-09-19 DIAGNOSIS — Z791 Long term (current) use of non-steroidal anti-inflammatories (NSAID): Secondary | ICD-10-CM | POA: Diagnosis not present

## 2023-09-19 DIAGNOSIS — Z113 Encounter for screening for infections with a predominantly sexual mode of transmission: Secondary | ICD-10-CM | POA: Insufficient documentation

## 2023-09-19 DIAGNOSIS — Z130 Encounter for screening for diseases of the blood and blood-forming organs and certain disorders involving the immune mechanism: Secondary | ICD-10-CM

## 2023-09-19 DIAGNOSIS — Z13220 Encounter for screening for lipoid disorders: Secondary | ICD-10-CM | POA: Diagnosis not present

## 2023-09-19 DIAGNOSIS — Z23 Encounter for immunization: Secondary | ICD-10-CM

## 2023-09-19 DIAGNOSIS — Z0000 Encounter for general adult medical examination without abnormal findings: Secondary | ICD-10-CM | POA: Diagnosis not present

## 2023-09-19 DIAGNOSIS — Z1329 Encounter for screening for other suspected endocrine disorder: Secondary | ICD-10-CM

## 2023-09-19 DIAGNOSIS — Z13228 Encounter for screening for other metabolic disorders: Secondary | ICD-10-CM | POA: Diagnosis not present

## 2023-09-19 DIAGNOSIS — N946 Dysmenorrhea, unspecified: Secondary | ICD-10-CM

## 2023-09-19 MED ORDER — NAPROXEN 500 MG PO TABS: 500.0000 mg | ORAL_TABLET | Freq: Every day | ORAL | 1 refills | Status: AC | PRN

## 2023-09-19 NOTE — Progress Notes (Signed)
 Medication refill

## 2023-09-19 NOTE — Progress Notes (Signed)
 Patient ID: Ashley Petersen, female    DOB: Jun 13, 2000  MRN: [id number]  CC: Annual Exam  Subjective: Ashley Petersen is a 23 y.o. female who presents for annual exam.   Her concerns today include:  - Up to date on cervical cancer screening per [facility].  - Doing well on Naproxen , no issues/concerns. States over the last few months she noticed heavy menstrual flow using 1 tampon per hour.   Patient Active Problem List   Diagnosis Date Noted   Tonsillitis 04/07/2022   Bacterial vaginitis 01/01/2021   Candida vaginitis 01/01/2021   Asthma 12/30/2020   H/O renal calculi 12/18/2013     Current Outpatient Medications on File Prior to Visit  Medication Sig Dispense Refill   albuterol  (VENTOLIN  HFA) 108 (90 Base) MCG/ACT inhaler Inhale 1-2 puffs into the lungs every 6 (six) hours as needed for wheezing or shortness of breath. 18 g 1   beclomethasone (QVAR ) 40 MCG/ACT inhaler Inhale 2 puffs into the lungs 2 (two) times daily. 1 each 2   EPINEPHrine  (EPIPEN  2-PAK) 0.3 mg/0.3 mL IJ SOAJ injection Inject 0.3 mg into the muscle as needed for anaphylaxis. 2 each 1   methimazole  (TAPAZOLE ) 5 MG tablet TAKE 1/2 A TABLET BY MOUTH MONDAY THROUGH SATURDAY. NONE ON SUNDAYS 33 tablet 2   albuterol  (VENTOLIN  HFA) 108 (90 Base) MCG/ACT inhaler Inhale 2 puffs into the lungs every 6 (six) hours as needed for wheezing or shortness of breath. (Patient not taking: Reported on 06/05/2023) 8 g 0   No current facility-administered medications on file prior to visit.    Allergies  Allergen Reactions   Pecan Nut (Diagnostic) Hives, Itching, Shortness Of Breath and Swelling   Tree Extract Anaphylaxis    From an allergy test From an allergy test    Peanut-Containing Drug Products Swelling and Other (See Comments)    Social History   Socioeconomic History   Marital status: Single    Spouse name: Not on file   Number of children: Not on file   Years of education: Not on file   Highest education level:  Some college, no degree  Occupational History   Not on file  Tobacco Use   Smoking status: Never    Passive exposure: Never   Smokeless tobacco: Never  Vaping Use   Vaping status: Never Used  Substance and Sexual Activity   Alcohol use: Never   Drug use: Never   Sexual activity: Yes    Birth control/protection: None, Condom  Other Topics Concern   Not on file  Social History Narrative   Not on file   Social Drivers of Health   Financial Resource Strain: Low Risk  (09/15/2023)   Overall Financial Resource Strain (CARDIA)    Difficulty of Paying Living Expenses: Not hard at all  Food Insecurity: No Food Insecurity (09/15/2023)   Hunger Vital Sign    Worried About Running Out of Food in the Last Year: Never true    Ran Out of Food in the Last Year: Never true  Transportation Needs: No Transportation Needs (09/15/2023)   PRAPARE - Administrator, Civil Service (Medical): No    Lack of Transportation (Non-Medical): No  Physical Activity: Sufficiently Active (09/15/2023)   Exercise Vital Sign    Days of Exercise per Week: 7 days    Minutes of Exercise per Session: 150+ min  Stress: No Stress Concern Present (09/15/2023)   [employer] of Occupational Health - Occupational Stress Questionnaire  Feeling of Stress: Only a little  Social Connections: Moderately Isolated (09/15/2023)   Social Connection and Isolation Panel    Frequency of Communication with Friends and Family: More than three times a week    Frequency of Social Gatherings with Friends and Family: Twice a week    Attends Religious Services: Never    Database administrator or Organizations: No    Attends Engineer, structural: Not on file    Marital Status: Living with partner  Intimate Partner Violence: Not At Risk (09/19/2023)   Humiliation, Afraid, Rape, and Kick questionnaire    Fear of Current or Ex-Partner: No    Emotionally Abused: No    Physically Abused: No    Sexually Abused: No     Family History  Problem Relation Age of Onset   Diabetes Mother    Hypertension Father     No past surgical history on file.  ROS: Review of Systems Negative except as stated above  PHYSICAL EXAM: BP 134/85   Pulse 97   Temp 99 F (37.2 C) (Oral)   Resp 16   Ht 5' 5 (1.651 m)   Wt 216 lb 3.2 oz (98.1 kg)   LMP 09/05/2023 (Approximate)   SpO2 94%   BMI 35.98 kg/m   Physical Exam HENT:     Head: Normocephalic and atraumatic.     Right Ear: Tympanic membrane, ear canal and external ear normal.     Left Ear: Tympanic membrane, ear canal and external ear normal.     Nose: Nose normal.     Mouth/Throat:     Mouth: Mucous membranes are moist.     Pharynx: Oropharynx is clear.  Eyes:     Extraocular Movements: Extraocular movements intact.     Conjunctiva/sclera: Conjunctivae normal.     Pupils: Pupils are equal, round, and reactive to light.  Neck:     Thyroid : No thyroid  mass, thyromegaly or thyroid  tenderness.  Cardiovascular:     Rate and Rhythm: Normal rate and regular rhythm.     Pulses: Normal pulses.     Heart sounds: Normal heart sounds.  Pulmonary:     Effort: Pulmonary effort is normal.     Breath sounds: Normal breath sounds.  Chest:     Comments: Patient declined. Abdominal:     General: Bowel sounds are normal.     Palpations: Abdomen is soft.  Genitourinary:    Comments: Patient declined. Musculoskeletal:        General: Normal range of motion.     Right shoulder: Normal.     Left shoulder: Normal.     Right upper arm: Normal.     Left upper arm: Normal.     Right elbow: Normal.     Left elbow: Normal.     Right forearm: Normal.     Left forearm: Normal.     Right wrist: Normal.     Left wrist: Normal.     Right hand: Normal.     Left hand: Normal.     Cervical back: Normal, normal range of motion and neck supple.     Thoracic back: Normal.     Lumbar back: Normal.     Right hip: Normal.     Left hip: Normal.     Right upper leg:  Normal.     Left upper leg: Normal.     Right knee: Normal.     Left knee: Normal.     Right lower leg: Normal.  Left lower leg: Normal.     Right ankle: Normal.     Left ankle: Normal.     Right foot: Normal.     Left foot: Normal.  Skin:    General: Skin is warm and dry.     Capillary Refill: Capillary refill takes less than 2 seconds.  Neurological:     General: No focal deficit present.     Mental Status: She is alert and oriented to person, place, and time.  Psychiatric:        Mood and Affect: Mood normal.        Behavior: Behavior normal.     ASSESSMENT AND PLAN: 1. Annual physical exam (Primary) - Counseled on 150 minutes of exercise per week as tolerated, healthy eating (including decreased daily intake of saturated fats, cholesterol, added sugars, sodium), STI prevention, and routine healthcare maintenance.  2. Screening for metabolic disorder - Routine screening.  - CMP14+EGFR  3. Screening for deficiency anemia - Routine screening.  - CBC  4. Diabetes mellitus screening - Routine screening.  - Hemoglobin A1c  5. Screening cholesterol level - Routine screening.  - Lipid panel  6. Thyroid  disorder screen - Routine screening.  - TSH  7. Routine screening for STI (sexually transmitted infection) - Routine screening.  - Cervicovaginal ancillary only  8. Dysmenorrhea - Continue Naproxen  as prescribed. Counseled on medication adherence/adverse effects.  - Referral to Gynecology for evaluation/management.  - Follow-up with primary provider as scheduled. - naproxen  (NAPROSYN ) 500 MG tablet; Take 1 tablet (500 mg total) by mouth daily as needed.  Dispense: 90 tablet; Refill: 1 - Ambulatory referral to Gynecology  9. Immunization due - Administered.  - Flu vaccine trivalent PF, 6mos and older(Flulaval,Afluria,Fluarix,Fluzone)   Patient was given the opportunity to ask questions.  Patient verbalized understanding of the plan and was able to repeat key  elements of the plan. Patient was given clear instructions to go to Emergency Department or return to medical center if symptoms don't improve, worsen, or new problems develop.The patient verbalized understanding.   Orders Placed This Encounter  Procedures   Flu vaccine trivalent PF, 6mos and older(Flulaval,Afluria,Fluarix,Fluzone)   CBC   Lipid panel   CMP14+EGFR   Hemoglobin A1c   TSH   Ambulatory referral to Gynecology     Requested Prescriptions   Signed Prescriptions Disp Refills   naproxen  (NAPROSYN ) 500 MG tablet 90 tablet 1    Sig: Take 1 tablet (500 mg total) by mouth daily as needed.    Return in about 1 year (around 09/18/2024) for Physical per patient preference.  Greig JINNY Drones, NP

## 2023-09-20 ENCOUNTER — Ambulatory Visit: Payer: Self-pay | Admitting: Family

## 2023-09-20 DIAGNOSIS — E785 Hyperlipidemia, unspecified: Secondary | ICD-10-CM

## 2023-09-20 DIAGNOSIS — Z130 Encounter for screening for diseases of the blood and blood-forming organs and certain disorders involving the immune mechanism: Secondary | ICD-10-CM

## 2023-09-20 DIAGNOSIS — N760 Acute vaginitis: Secondary | ICD-10-CM

## 2023-09-20 LAB — CMP14+EGFR
ALT: 26 IU/L (ref 0–32)
AST: 25 IU/L (ref 0–40)
Albumin: 4.4 g/dL (ref 4.0–5.0)
Alkaline Phosphatase: 70 IU/L (ref 44–121)
BUN/Creatinine Ratio: 18 (ref 9–23)
BUN: 10 mg/dL (ref 6–20)
Bilirubin Total: 0.3 mg/dL (ref 0.0–1.2)
CO2: 22 mmol/L (ref 20–29)
Calcium: 9.5 mg/dL (ref 8.7–10.2)
Chloride: 102 mmol/L (ref 96–106)
Creatinine, Ser: 0.57 mg/dL (ref 0.57–1.00)
Globulin, Total: 2.7 g/dL (ref 1.5–4.5)
Glucose: 93 mg/dL (ref 70–99)
Potassium: 4 mmol/L (ref 3.5–5.2)
Sodium: 140 mmol/L (ref 134–144)
Total Protein: 7.1 g/dL (ref 6.0–8.5)
eGFR: 130 mL/min/1.73 (ref >59)

## 2023-09-20 LAB — CERVICOVAGINAL ANCILLARY ONLY
Bacterial Vaginitis (gardnerella): POSITIVE — AB
Candida Glabrata: NEGATIVE
Candida Vaginitis: NEGATIVE
Chlamydia: NEGATIVE
Comment: NEGATIVE
Comment: NEGATIVE
Comment: NEGATIVE
Comment: NEGATIVE
Comment: NEGATIVE
Comment: NORMAL
Neisseria Gonorrhea: NEGATIVE
Trichomonas: NEGATIVE

## 2023-09-20 LAB — CBC
Hematocrit: 37.5 % (ref 34.0–46.6)
Hemoglobin: 10.5 g/dL — ABNORMAL LOW (ref 11.1–15.9)
MCH: 19.4 pg — ABNORMAL LOW (ref 26.6–33.0)
MCHC: 28 g/dL — ABNORMAL LOW (ref 31.5–35.7)
MCV: 69 fL — ABNORMAL LOW (ref 79–97)
Platelets: 375 x10E3/uL (ref 150–450)
RBC: 5.4 x10E6/uL — ABNORMAL HIGH (ref 3.77–5.28)
RDW: 19.6 % — ABNORMAL HIGH (ref 11.7–15.4)
WBC: 6.5 x10E3/uL (ref 3.4–10.8)

## 2023-09-20 LAB — LIPID PANEL
Chol/HDL Ratio: 4.8 ratio — ABNORMAL HIGH (ref 0.0–4.4)
Cholesterol, Total: 219 mg/dL — ABNORMAL HIGH (ref 100–199)
HDL: 46 mg/dL (ref >39)
LDL Chol Calc (NIH): 160 mg/dL — ABNORMAL HIGH (ref 0–99)
Triglycerides: 72 mg/dL (ref 0–149)
VLDL Cholesterol Cal: 13 mg/dL (ref 5–40)

## 2023-09-20 LAB — HEMOGLOBIN A1C
Est. average glucose Bld gHb Est-mCnc: 111 mg/dL
Hgb A1c MFr Bld: 5.5 % (ref 4.8–5.6)

## 2023-09-20 LAB — TSH: TSH: 1.07 u[IU]/mL (ref 0.450–4.500)

## 2023-09-20 MED ORDER — ATORVASTATIN CALCIUM 20 MG PO TABS: 20.0000 mg | ORAL_TABLET | Freq: Every day | ORAL | 0 refills | Status: AC

## 2023-09-20 MED ORDER — METRONIDAZOLE 500 MG PO TABS: 500.0000 mg | ORAL_TABLET | Freq: Two times a day (BID) | ORAL | 0 refills | Status: AC

## 2023-10-22 DIAGNOSIS — R0981 Nasal congestion: Secondary | ICD-10-CM | POA: Diagnosis not present

## 2023-10-22 DIAGNOSIS — R059 Cough, unspecified: Secondary | ICD-10-CM | POA: Diagnosis not present

## 2023-10-22 DIAGNOSIS — J069 Acute upper respiratory infection, unspecified: Secondary | ICD-10-CM | POA: Diagnosis not present

## 2023-10-22 DIAGNOSIS — Z20822 Contact with and (suspected) exposure to covid-19: Secondary | ICD-10-CM | POA: Diagnosis not present

## 2023-12-06 ENCOUNTER — Ambulatory Visit: Admitting: Internal Medicine

## 2023-12-06 ENCOUNTER — Encounter: Payer: Self-pay | Admitting: Internal Medicine

## 2023-12-06 VITALS — BP 122/80 | HR 96 | Ht 65.0 in | Wt 213.0 lb

## 2023-12-06 DIAGNOSIS — E0590 Thyrotoxicosis, unspecified without thyrotoxic crisis or storm: Secondary | ICD-10-CM | POA: Diagnosis not present

## 2023-12-06 MED ORDER — METHIMAZOLE 5 MG PO TABS: ORAL_TABLET | ORAL | 2 refills | Status: AC

## 2023-12-06 NOTE — Progress Notes (Signed)
 Name: Ashley Petersen  MRN/ DOB: [id number], 03/04/2000    Age/ Sex: 23 y.o., female    PCP: Jaycee Greig PARAS, NP   Reason for Endocrinology Evaluation: Subclinical Hyperthyroidism     Date of Initial Endocrinology Evaluation: 09/01/2021    HPI: [name] is a 23 y.o. female with a past medical history of Subclinical Hyperthyroidism. The patient presented for initial endocrinology clinic visit on 09/01/2021 for consultative assistance with her Subclinical Hyperthyroidism .   She has been diagnosed with subclinical hyperthyroidism in 12/2020 with a TSH nadir of 0.210 uIU/ml and normal FT4 and T3.  On her initial visit to our clinic she had a TSH of 0.32 uIU/mL with normal free T4 and T3, but we opted to start on methimazole  due to symptoms of tremors, palpitations, and shortness of breath Patient was started on methimazole  08/2021    TRAB was not elevated but it was detectable at 1.79 IU/L  No family history of thyroid  disease  SUBJECTIVE:    Today (12/06/23):  [name] is here for a follow up on hyperthyroidism.   No local neck swelling  Continues with palpitations No SOB   No constipation or diarrhea   Mild eye  burning  of the eyes, last eye exam was ~ 1 yr ago   Methimazole  5 mg, half a tablet Monday through Friday only    HISTORY:  Past Medical History:  Past Medical History:  Diagnosis Date   Asthma    Hyperthyroidism 01/2022   Past Surgical History: No past surgical history on file.  Social History:  reports that she has never smoked. She has never been exposed to tobacco smoke. She has never used smokeless tobacco. She reports that she does not drink alcohol and does not use drugs. Family History: family history includes Diabetes in her mother; Hypertension in her father.   HOME MEDICATIONS: Allergies as of 12/06/2023       Reactions   Pecan Nut (diagnostic) Hives, Itching, Shortness Of Breath, Swelling   Tree Extract Anaphylaxis   From an  allergy test From an allergy test   Peanut-containing Drug Products Swelling, Other (See Comments)        Medication List        Accurate as of December 06, 2023 11:09 AM. If you have any questions, ask your nurse or doctor.          albuterol  108 (90 Base) MCG/ACT inhaler Commonly known as: VENTOLIN  HFA Inhale 1-2 puffs into the lungs every 6 (six) hours as needed for wheezing or shortness of breath. What changed: Another medication with the same name was removed. Continue taking this medication, and follow the directions you see here. Changed by: Donell PARAS Perlita Forbush   atorvastatin  20 MG tablet Commonly known as: LIPITOR Take 1 tablet (20 mg total) by mouth daily.   beclomethasone 40 MCG/ACT inhaler Commonly known as: QVAR  Inhale 2 puffs into the lungs 2 (two) times daily.   EPINEPHrine  0.3 mg/0.3 mL Soaj injection Commonly known as: EpiPen  2-Pak Inject 0.3 mg into the muscle as needed for anaphylaxis.   methimazole  5 MG tablet Commonly known as: TAPAZOLE  TAKE 1/2 A TABLET BY MOUTH MONDAY THROUGH SATURDAY. NONE ON SUNDAYS   naproxen  500 MG tablet Commonly known as: NAPROSYN  Take 1 tablet (500 mg total) by mouth daily as needed.          REVIEW OF SYSTEMS: A comprehensive ROS was conducted with the patient and is negative except as per  HPI     OBJECTIVE:  VS:BP 122/80   Pulse 96   Ht 5' 5 (1.651 m)   Wt 213 lb (96.6 kg)   SpO2 100%   BMI 35.45 kg/m     Wt Readings from Last 3 Encounters:  12/06/23 213 lb (96.6 kg)  09/19/23 216 lb 3.2 oz (98.1 kg)  06/05/23 218 lb (98.9 kg)     EXAM: General: Pt appears well and is in NAD  Neck: General: Supple without adenopathy. Thyroid : Thyroid  size normal.  No goiter or nodules appreciated.   Lungs: Clear with good BS bilat   Heart: Auscultation: RRR.  Abdomen: Soft, nontender  Extremities:  BL LE: No pretibial edema  Mental Status: Judgment, insight: Intact Orientation: Oriented to time, place,  and person Mood and affect: No depression, anxiety, or agitation     DATA REVIEWED:   Latest Reference Range & Units 09/19/23 09:32  TSH 0.450 - 4.500 uIU/mL 1.070     ASSESSMENT/PLAN/RECOMMENDATIONS:   Subclinical Hyperthyroidism:  -Patient is clinically euthyroid except for palpitations -Clinical scenario consistent with Graves' disease, TRAB  detectable but not elevated - TFTs through PCPs office 2 months ago was within normal range, no change  Medications : Continue methimazole  5 mg, half a tablet Monday through Friday , none on Saturday or Sundays   2. Palpitations :   - Not thyroid  related as her thyroid  has been within normal range -Discussed differential diagnosis to include caffeine intake versus anxiety -If symptoms persist/worsen, patient to follow-up with PCP   Follow-up in 6 months  Signed electronically by: Stefano Redgie Butts, MD  [facility]  [facility] [address] [place], [place] [address] Phone: [phone] FAX: [phone]   CC: Jaycee Greig PARAS, NP [address] [place] [address] Phone: [phone] Fax: [phone]   Return to Endocrinology clinic as below: Future Appointments  Date Time Provider Department Center  12/06/2023 11:10 AM Dashaun Onstott, Donell Redgie, MD [id number] None

## 2023-12-07 ENCOUNTER — Encounter: Payer: Self-pay | Admitting: Family

## 2024-02-03 ENCOUNTER — Encounter: Payer: Self-pay | Admitting: Emergency Medicine

## 2024-02-03 ENCOUNTER — Ambulatory Visit: Admitting: Radiology

## 2024-02-03 ENCOUNTER — Ambulatory Visit
Admission: EM | Admit: 2024-02-03 | Discharge: 2024-02-03 | Disposition: A | Discharge status: Home / Self Care | Attending: Student | Admitting: Student

## 2024-02-03 DIAGNOSIS — J4521 Mild intermittent asthma with (acute) exacerbation: Secondary | ICD-10-CM

## 2024-02-03 MED ORDER — PREDNISONE 20 MG PO TABS: 40.0000 mg | ORAL_TABLET | Freq: Every day | ORAL | 0 refills | Status: AC

## 2024-02-03 MED ORDER — PROMETHAZINE-DM 6.25-15 MG/5ML PO SYRP: 5.0000 mL | ORAL_SOLUTION | Freq: Four times a day (QID) | ORAL | 0 refills | Status: AC | PRN

## 2024-02-03 MED ORDER — IPRATROPIUM-ALBUTEROL 0.5-2.5 (3) MG/3ML IN SOLN
3.0000 mL | Freq: Once | RESPIRATORY_TRACT | Status: AC
  Administered 2024-02-03: 3 mL via RESPIRATORY_TRACT

## 2024-02-03 NOTE — ED Triage Notes (Signed)
 Patient has a sore throat and a cough that makes her chest hurt that start yesterday afternoon.  Patient denies any fever.  Patient has taken night quil.

## 2024-02-03 NOTE — ED Provider Notes (Signed)
 " [name]    CSN: [id number] Arrival date & time: 02/03/24  0801      History   Chief Complaint Chief Complaint  Patient presents with   Cough   Sore Throat    HPI Ashley Petersen is a 24 y.o. female presenting with viral syndrome. Patient has a sore throat and a cough that makes her chest hurt that start yesterday afternoon. Describes chest discomfort as tightness and burning. States the chest discomfort is with coughing and inspiration, but there is not pain at rest.  Patient denies any fever. Cough is occasionally productive of dark sputum. Patient has taken night quil.  She has a history of mild intermittent asthma, using albuterol  inhaler; has required 3x since being sick.   HPI  Past Medical History:  Diagnosis Date   Asthma    Hyperthyroidism 01/2022    Patient Active Problem List   Diagnosis Date Noted   Tonsillitis 04/07/2022   Bacterial vaginitis 01/01/2021   Candida vaginitis 01/01/2021   Asthma 12/30/2020   H/O renal calculi 12/18/2013    History reviewed. No pertinent surgical history.  OB History   No obstetric history on file.      Home Medications    Prior to Admission medications  Medication Sig Start Date End Date Taking? Authorizing Provider  predniSONE  (DELTASONE ) 20 MG tablet Take 2 tablets (40 mg total) by mouth daily for 5 days. Take with breakfast or lunch. Avoid NSAIDs (ibuprofen , etc) while taking this medication. 02/03/24 02/08/24 Yes Melana Hingle E, PA-C  promethazine -dextromethorphan (PROMETHAZINE -DM) 6.25-15 MG/5ML syrup Take 5 mLs by mouth 4 (four) times daily as needed for cough. 02/03/24  Yes Moksha Dorgan E, PA-C  albuterol  (VENTOLIN  HFA) 108 (90 Base) MCG/ACT inhaler Inhale 1-2 puffs into the lungs every 6 (six) hours as needed for wheezing or shortness of breath. 12/30/20   Jaycee Greig PARAS, NP  atorvastatin  (LIPITOR) 20 MG tablet Take 1 tablet (20 mg total) by mouth daily. 09/20/23   Jaycee Greig PARAS, NP  beclomethasone  (QVAR ) 40 MCG/ACT inhaler Inhale 2 puffs into the lungs 2 (two) times daily. 12/30/20   Jaycee Greig PARAS, NP  EPINEPHrine  (EPIPEN  2-PAK) 0.3 mg/0.3 mL IJ SOAJ injection Inject 0.3 mg into the muscle as needed for anaphylaxis. 12/15/21   Jaycee Greig PARAS, NP  methimazole  (TAPAZOLE ) 5 MG tablet TAKE 1/2 A TABLET BY MOUTH MONDAY THROUGH SATURDAY. NONE ON SUNDAYS 12/06/23   Shamleffer, Ibtehal Jaralla, MD  naproxen  (NAPROSYN ) 500 MG tablet Take 1 tablet (500 mg total) by mouth daily as needed. 09/19/23   Jaycee Greig PARAS, NP    Family History Family History  Problem Relation Age of Onset   Diabetes Mother    Hypertension Father     Social History Social History[1]   Allergies   Pecan nut (diagnostic), Tree extract, and Peanut-containing drug products   Review of Systems Review of Systems  Constitutional:  Negative for appetite change, chills and fever.  HENT:  Positive for sore throat. Negative for congestion, ear pain, rhinorrhea, sinus pressure and sinus pain.   Eyes:  Negative for redness and visual disturbance.  Respiratory:  Positive for cough. Negative for chest tightness, shortness of breath and wheezing.   Cardiovascular:  Negative for chest pain and palpitations.  Gastrointestinal:  Negative for abdominal pain, constipation, diarrhea, nausea and vomiting.  Genitourinary:  Negative for dysuria, frequency and urgency.  Musculoskeletal:  Negative for myalgias.  Neurological:  Negative for dizziness, weakness and headaches.  Psychiatric/Behavioral:  Negative for confusion.   All other systems reviewed and are negative.    Physical Exam Triage Vital Signs ED Triage Vitals  Encounter Vitals Group     BP 02/03/24 0815 133/87     Girls Systolic BP Percentile --      Girls Diastolic BP Percentile --      Boys Systolic BP Percentile --      Boys Diastolic BP Percentile --      Pulse Rate 02/03/24 0815 87     Resp 02/03/24 0815 18     Temp 02/03/24 0815 98.7 F (37.1 C)     Temp  Source 02/03/24 0815 Oral     SpO2 02/03/24 0815 96 %     Weight --      Height --      Head Circumference --      Peak Flow --      Pain Score 02/03/24 0814 5     Pain Loc --      Pain Education --      Exclude from Growth Chart --    No data found.  Updated Vital Signs BP 133/87 (BP Location: Right Arm)   Pulse 87   Temp 98.7 F (37.1 C) (Oral)   Resp 18   LMP 02/03/2024 (Exact Date)   SpO2 96%   Visual Acuity Right Eye Distance:   Left Eye Distance:   Bilateral Distance:    Right Eye Near:   Left Eye Near:    Bilateral Near:     Physical Exam Vitals reviewed.  Constitutional:      General: She is not in acute distress.    Appearance: Normal appearance. She is not ill-appearing.  HENT:     Head: Normocephalic and atraumatic.     Right Ear: Tympanic membrane, ear canal and external ear normal. No tenderness. No middle ear effusion. There is no impacted cerumen. Tympanic membrane is not perforated, erythematous, retracted or bulging.     Left Ear: Tympanic membrane, ear canal and external ear normal. No tenderness.  No middle ear effusion. There is no impacted cerumen. Tympanic membrane is not perforated, erythematous, retracted or bulging.     Nose: Nose normal. No congestion.     Mouth/Throat:     Mouth: Mucous membranes are moist.     Pharynx: Uvula midline. No oropharyngeal exudate or posterior oropharyngeal erythema.     Tonsils: No tonsillar exudate.  Eyes:     Extraocular Movements: Extraocular movements intact.     Pupils: Pupils are equal, round, and reactive to light.  Cardiovascular:     Rate and Rhythm: Normal rate and regular rhythm.     Heart sounds: Normal heart sounds.  Pulmonary:     Effort: Pulmonary effort is normal.     Breath sounds: Decreased breath sounds present. No wheezing, rhonchi or rales.     Comments: On initial exam, decreased breath sounds throughout.  Following DuoNeb treatment, lungs are open and clear to auscultation. Chest:      Comments: Chest discomfort is not reproducible Abdominal:     Palpations: Abdomen is soft.     Tenderness: There is no abdominal tenderness. There is no guarding or rebound.  Musculoskeletal:     Comments: NO LE swelling or edema  Lymphadenopathy:     Cervical: No cervical adenopathy.     Right cervical: No superficial, deep or posterior cervical adenopathy.    Left cervical: No superficial, deep or posterior cervical adenopathy.  Skin:  Comments: No rash   Neurological:     General: No focal deficit present.     Mental Status: She is alert and oriented to person, place, and time.  Psychiatric:        Mood and Affect: Mood normal.        Behavior: Behavior normal.        Thought Content: Thought content normal.        Judgment: Judgment normal.      UC Treatments / Results  Labs (all labs ordered are listed, but only abnormal results are displayed) Labs Reviewed - No data to display  EKG   Radiology No results found.  Procedures Procedures (including critical care time)  Medications Ordered in UC Medications  ipratropium-albuterol  (DUONEB) 0.5-2.5 (3) MG/3ML nebulizer solution 3 mL (3 mLs Nebulization Given 02/03/24 0829)    Initial Impression / Assessment and Plan / UC Course  I have reviewed the triage vital signs and the nursing notes.  Pertinent labs & imaging results that were available during my care of the patient were reviewed by me and considered in my medical decision making (see chart for details).     Patient is a pleasant 24 y.o. female presenting with exacerbation of mild intermittent asthma. The patient is afebrile and nontachycardic.  Antipyretic has not been administered today.  She is currently on her menstrual cycle.  On initial exam, decreased breath sounds throughout.  Following DuoNeb treatment, lungs are open and clear to auscultation.  Chest x-ray: 1. No acute cardiopulmonary pathology.  Promethazine  DM sent.  Prednisone  sent.   Continue albuterol  inhaler, which she has already at home.  Return precautions as below.  -Coding Level 4 for acute exacerbation of chronic condition and prescription drug management.  Final Clinical Impressions(s) / UC Diagnoses   Final diagnoses:  Mild intermittent asthma with (acute) exacerbation     Discharge Instructions      -I will call within 2 hours with abnormal xray results. If your xray is normal, the results will go to your mychart, and I will not call.  -Promethazine  DM cough syrup for congestion/cough. This could make you drowsy, so take at night before bed. -Prednisone , 2 pills taken at the same time for 5 days in a row.  Try taking this earlier in the day as it can give you energy. Avoid NSAIDs like ibuprofen  and alleve while taking this medication as they can increase your risk of stomach upset and even GI bleeding when in combination with a steroid. You can continue tylenol  (acetaminophen ) up to 1000mg  3x daily. -Your cough should slowly get better instead of worse. If you develop a cough productive of dark or red sputum, new shortness of breath, new chest tightness, new fevers, etc - seek additional care.     ED Prescriptions     Medication Sig Dispense Auth. Provider   promethazine -dextromethorphan (PROMETHAZINE -DM) 6.25-15 MG/5ML syrup Take 5 mLs by mouth 4 (four) times daily as needed for cough. 118 mL Ashland Osmer E, PA-C   predniSONE  (DELTASONE ) 20 MG tablet Take 2 tablets (40 mg total) by mouth daily for 5 days. Take with breakfast or lunch. Avoid NSAIDs (ibuprofen , etc) while taking this medication. 10 tablet Hanh Kertesz E, PA-C      PDMP not reviewed this encounter.     [1]  Social History Tobacco Use   Smoking status: Never    Passive exposure: Never   Smokeless tobacco: Never  Vaping Use   Vaping status: Never Used  Substance Use Topics   Alcohol use: Never   Drug use: Never     Arlyss Leita BRAVO, PA-C 02/03/24 1049  "

## 2024-02-03 NOTE — Discharge Instructions (Addendum)
-   Please go to the clinic listed below for your x-ray.  You do not need to check in, and you do not need to be seen by a provider.  You should not have to wait very long.  I will call within 2 hours with abnormal xray results. If your xray is normal, the results will go to your mychart, and I will not call.  -Promethazine  DM cough syrup for congestion/cough. This could make you drowsy, so take at night before bed. -Prednisone , 2 pills taken at the same time for 5 days in a row.  Try taking this earlier in the day as it can give you energy. Avoid NSAIDs like ibuprofen  and alleve while taking this medication as they can increase your risk of stomach upset and even GI bleeding when in combination with a steroid. You can continue tylenol  (acetaminophen ) up to 1000mg  3x daily. -Your cough should slowly get better instead of worse. If you develop a cough productive of dark or red sputum, new shortness of breath, new chest tightness, new fevers, etc - seek additional care.

## 2024-02-05 ENCOUNTER — Emergency Department (HOSPITAL_BASED_OUTPATIENT_CLINIC_OR_DEPARTMENT_OTHER)
Admission: EM | Admit: 2024-02-05 | Discharge: 2024-02-05 | Disposition: A | Discharge status: Home / Self Care | Attending: Emergency Medicine | Admitting: Emergency Medicine

## 2024-02-05 ENCOUNTER — Emergency Department (HOSPITAL_BASED_OUTPATIENT_CLINIC_OR_DEPARTMENT_OTHER): Admitting: Radiology

## 2024-02-05 ENCOUNTER — Other Ambulatory Visit: Payer: Self-pay

## 2024-02-05 ENCOUNTER — Ambulatory Visit (HOSPITAL_COMMUNITY): Payer: Self-pay

## 2024-02-05 DIAGNOSIS — R059 Cough, unspecified: Secondary | ICD-10-CM | POA: Diagnosis present

## 2024-02-05 DIAGNOSIS — Z91010 Allergy to peanuts: Secondary | ICD-10-CM | POA: Diagnosis not present

## 2024-02-05 DIAGNOSIS — J45909 Unspecified asthma, uncomplicated: Secondary | ICD-10-CM | POA: Diagnosis not present

## 2024-02-05 DIAGNOSIS — J101 Influenza due to other identified influenza virus with other respiratory manifestations: Secondary | ICD-10-CM | POA: Insufficient documentation

## 2024-02-05 LAB — RESP PANEL BY RT-PCR (RSV, FLU A&B, COVID)  RVPGX2
Influenza A by PCR: POSITIVE — AB
Influenza B by PCR: NEGATIVE
Resp Syncytial Virus by PCR: NEGATIVE
SARS Coronavirus 2 by RT PCR: NEGATIVE

## 2024-02-05 MED ORDER — ACETAMINOPHEN 500 MG PO TABS
1000.0000 mg | ORAL_TABLET | Freq: Once | ORAL | Status: AC
  Administered 2024-02-05: 1000 mg via ORAL
  Filled 2024-02-05: qty 2

## 2024-02-05 MED ORDER — KETOROLAC TROMETHAMINE 60 MG/2ML IM SOLN
30.0000 mg | Freq: Once | INTRAMUSCULAR | Status: AC
  Administered 2024-02-05: 30 mg via INTRAMUSCULAR
  Filled 2024-02-05: qty 2

## 2024-02-05 MED ORDER — BENZONATATE 100 MG PO CAPS: 100.0000 mg | ORAL_CAPSULE | Freq: Three times a day (TID) | ORAL | 0 refills | Status: AC

## 2024-02-05 NOTE — Discharge Instructions (Addendum)
 Tested positive for influenza.  You are outside the window for Tamiflu.  Recommend supportive care with Tylenol  and Motrin  for fever control and muscle aches, drink plenty of fluids, return for any severe worsening symptoms.

## 2024-02-05 NOTE — ED Provider Notes (Signed)
 " [place] [facility] AT [facility] Provider Note   CSN: [id number] Arrival date & time: 02/05/24  9052     Patient presents with: Cough and Headache   Ashley Petersen is a 25 y.o. female.    Cough Associated symptoms: headaches   Headache Associated symptoms: cough      24 year old female with medical history significant for asthma presenting to the emergency department with cough, chest discomfort associated with cough, headache, sore throat and nasal congestion.  Was seen at urgent care 2 days ago and discharged home with prednisone  and promethazine .  The patient states that she has had persistent symptoms of an influenza-like illness with associated nasal congestion and cough.  She uses inhalers at home for her asthma.  She is tolerating oral intake.  Prior to Admission medications  Medication Sig Start Date End Date Taking? Authorizing Provider  benzonatate  (TESSALON ) 100 MG capsule Take 1 capsule (100 mg total) by mouth every 8 (eight) hours. 02/05/24  Yes Jerrol Agent, MD  albuterol  (VENTOLIN  HFA) 108 (90 Base) MCG/ACT inhaler Inhale 1-2 puffs into the lungs every 6 (six) hours as needed for wheezing or shortness of breath. 12/30/20   Jaycee Greig PARAS, NP  atorvastatin  (LIPITOR) 20 MG tablet Take 1 tablet (20 mg total) by mouth daily. 09/20/23   Jaycee Greig PARAS, NP  beclomethasone (QVAR ) 40 MCG/ACT inhaler Inhale 2 puffs into the lungs 2 (two) times daily. 12/30/20   Jaycee Greig PARAS, NP  EPINEPHrine  (EPIPEN  2-PAK) 0.3 mg/0.3 mL IJ SOAJ injection Inject 0.3 mg into the muscle as needed for anaphylaxis. 12/15/21   Jaycee Greig PARAS, NP  methimazole  (TAPAZOLE ) 5 MG tablet TAKE 1/2 A TABLET BY MOUTH MONDAY THROUGH SATURDAY. NONE ON SUNDAYS 12/06/23   Shamleffer, Ibtehal Jaralla, MD  naproxen  (NAPROSYN ) 500 MG tablet Take 1 tablet (500 mg total) by mouth daily as needed. 09/19/23   Jaycee Greig PARAS, NP  predniSONE  (DELTASONE ) 20 MG tablet Take 2 tablets (40 mg total) by mouth daily  for 5 days. Take with breakfast or lunch. Avoid NSAIDs (ibuprofen , etc) while taking this medication. 02/03/24 02/08/24  Graham, Laura E, PA-C  promethazine -dextromethorphan (PROMETHAZINE -DM) 6.25-15 MG/5ML syrup Take 5 mLs by mouth 4 (four) times daily as needed for cough. 02/03/24   Graham, Laura E, PA-C    Allergies: Pecan nut (diagnostic), Tree extract, and Peanut-containing drug products    Review of Systems  Respiratory:  Positive for cough.   Neurological:  Positive for headaches.  All other systems reviewed and are negative.   Updated Vital Signs BP 114/77   Pulse 98   Temp 100.3 F (37.9 C) (Oral)   Resp 20   LMP 02/03/2024 (Exact Date)   SpO2 98%   Physical Exam Vitals and nursing note reviewed.  Constitutional:      General: She is not in acute distress.    Appearance: She is well-developed.  HENT:     Head: Normocephalic and atraumatic.  Eyes:     Conjunctiva/sclera: Conjunctivae normal.  Cardiovascular:     Rate and Rhythm: Normal rate and regular rhythm.  Pulmonary:     Effort: Pulmonary effort is normal. No respiratory distress.     Breath sounds: Normal breath sounds.  Abdominal:     Palpations: Abdomen is soft.     Tenderness: There is no abdominal tenderness.  Musculoskeletal:        General: No swelling.     Cervical back: Neck supple.  Skin:    General: Skin  is warm and dry.     Capillary Refill: Capillary refill takes less than 2 seconds.  Neurological:     Mental Status: She is alert.  Psychiatric:        Mood and Affect: Mood normal.     (all labs ordered are listed, but only abnormal results are displayed) Labs Reviewed  RESP PANEL BY RT-PCR (RSV, FLU A&B, COVID)  [id number] - Abnormal; Notable for the following components:      Result Value   Influenza A by PCR POSITIVE (*)    All other components within normal limits    EKG: None  Radiology: DG Chest 2 View Result Date: 02/05/2024 EXAM: 2 VIEW(S) XRAY OF THE CHEST 02/05/2024 10:30:00  AM COMPARISON: 02/03/2024 CLINICAL HISTORY: cough cough cough cough cough FINDINGS: LUNGS AND PLEURA: No focal pulmonary opacity. No pleural effusion. No pneumothorax. HEART AND MEDIASTINUM: No acute abnormality of the cardiac and mediastinal silhouettes. BONES AND SOFT TISSUES: No acute osseous abnormality. IMPRESSION: 1. No acute cardiopulmonary abnormality. Electronically signed by: Morgane Naveau MD 02/05/2024 10:57 AM EST RP Workstation: [id number]     Procedures   Medications Ordered in the ED  ketorolac  (TORADOL ) injection 30 mg (30 mg Intramuscular Given 02/05/24 1100)  acetaminophen  (TYLENOL ) tablet 1,000 mg (1,000 mg Oral Given 02/05/24 1059)    Clinical Course as of 02/05/24 1121  Mon Feb 05, 2024  1054 Influenza A By PCR(!): POSITIVE [JL]    Clinical Course User Index [JL] Jerrol Agent, MD                                 Medical Decision Making Amount and/or Complexity of Data Reviewed Labs:  Decision-making details documented in ED Course. Radiology: ordered.  Risk OTC drugs. Prescription drug management.    24 year old female with medical history significant for asthma presenting to the emergency department with cough, chest discomfort associated with cough, headache, sore throat and nasal congestion.  Was seen at urgent care 2 days ago and discharged home with prednisone  and promethazine .  The patient states that she has had persistent symptoms of an influenza-like illness with associated nasal congestion and cough.  She uses inhalers at home for her asthma.  She is tolerating oral intake.  On arrival, the patient was borderline febrile, temperature 100.3, tachycardic heart rate 108, not tachypneic, saturating 96% on room air.  On exam the patient had clear lungs to auscultation bilaterally.  Presenting with an influenza-like illness.  No wheezing appreciated on exam.  Considered bacterial pneumonia, other viral etiology.  CXR: Clear lungs, no acute cardiac or  pulmonary findings  Labs: PCR testing positive for influenza A.  The patient is tolerating oral intake, feeling symptomatically proved after Tylenol  and Toradol .  In no respiratory distress, saturating well on room air.  Outside the window for Tamiflu, advised continued outpatient symptomatic management with Tylenol  and Motrin  for muscle aches and fever, continued oral rehydration, Tessalon  prescribed for cough suppression.  Patient overall stable for discharge, return precautions provided.     Final diagnoses:  Influenza A    ED Discharge Orders          Ordered    benzonatate  (TESSALON ) 100 MG capsule  Every 8 hours        02/05/24 1116               Jerrol Agent, MD 02/05/24 1121  "

## 2024-02-05 NOTE — ED Triage Notes (Signed)
 Pt presents c/o ongoing cough, chest pain/discomfort, headache, sore throat since Friday. Seen at UC x2 days ago and sent home w/ prednisone  and promethazine , no relief. Reports cough is productive, coughing up dark, thick, yellow sputum. Hx asthma.

## 2024-02-05 NOTE — ED Notes (Signed)
 Pt d/c instructions, medications, and follow-up care reviewed with pt. Pt verbalized understanding and had no further questions at time of d/c. Pt CA&Ox4, ambulatory, and in NAD at time of d/c

## 2024-02-06 ENCOUNTER — Telehealth: Admitting: Emergency Medicine

## 2024-02-06 ENCOUNTER — Encounter: Payer: Self-pay | Admitting: Family

## 2024-02-06 ENCOUNTER — Encounter: Payer: Self-pay | Admitting: Internal Medicine

## 2024-02-06 DIAGNOSIS — R202 Paresthesia of skin: Secondary | ICD-10-CM

## 2024-02-06 NOTE — Patient Instructions (Signed)
 " Ashley Petersen, thank you for joining Ashley CHRISTELLA Belt, NP for today's virtual visit.  While this provider is not your primary care provider (PCP), if your PCP is located in our provider database this encounter information will be shared with them immediately following your visit.   A [facility] MyChart account gives you access to today's visit and all your visits, tests, and labs performed at [facility]  click here if you don't have a [facility] MyChart account or go to [email or web address]  Consent: (Patient) Ashley Petersen provided verbal consent for this virtual visit at the beginning of the encounter.  Current Medications:  Current Outpatient Medications:    albuterol  (VENTOLIN  HFA) 108 (90 Base) MCG/ACT inhaler, Inhale 1-2 puffs into the lungs every 6 (six) hours as needed for wheezing or shortness of breath., Disp: 18 g, Rfl: 1   atorvastatin  (LIPITOR) 20 MG tablet, Take 1 tablet (20 mg total) by mouth daily., Disp: 90 tablet, Rfl: 0   beclomethasone (QVAR ) 40 MCG/ACT inhaler, Inhale 2 puffs into the lungs 2 (two) times daily., Disp: 1 each, Rfl: 2   benzonatate  (TESSALON ) 100 MG capsule, Take 1 capsule (100 mg total) by mouth every 8 (eight) hours., Disp: 21 capsule, Rfl: 0   EPINEPHrine  (EPIPEN  2-PAK) 0.3 mg/0.3 mL IJ SOAJ injection, Inject 0.3 mg into the muscle as needed for anaphylaxis., Disp: 2 each, Rfl: 1   methimazole  (TAPAZOLE ) 5 MG tablet, TAKE 1/2 A TABLET BY MOUTH MONDAY THROUGH SATURDAY. NONE ON SUNDAYS, Disp: 33 tablet, Rfl: 2   naproxen  (NAPROSYN ) 500 MG tablet, Take 1 tablet (500 mg total) by mouth daily as needed., Disp: 90 tablet, Rfl: 1   predniSONE  (DELTASONE ) 20 MG tablet, Take 2 tablets (40 mg total) by mouth daily for 5 days. Take with breakfast or lunch. Avoid NSAIDs (ibuprofen , etc) while taking this medication., Disp: 10 tablet, Rfl: 0   promethazine -dextromethorphan (PROMETHAZINE -DM) 6.25-15 MG/5ML syrup, Take 5 mLs by mouth 4 (four) times daily  as needed for cough., Disp: 118 mL, Rfl: 0   Medications ordered in this encounter:  No orders of the defined types were placed in this encounter.    *If you need refills on other medications prior to your next appointment, please contact your pharmacy*  Follow-Up: Call back or seek an in-person evaluation if the symptoms worsen or if the condition fails to improve as anticipated.  [facility] [phone]  Other Instructions  Message your endocrinologist to see if methimazole  might be the problem. I am also concerned that it could be related to anemia. If you continue to have tingling/numbness in your fingers, please get checked in person, either by your primary care provider or at an urgent care. You may need some testing to figure out the cause.    If you have been instructed to have an in-person evaluation today at a local Urgent Care facility, please use the link below. It will take you to a list of all of our available [facility], including address, phone number and hours of operation. Please do not delay care.  [facility]  If you or a family member do not have a primary care provider, use the link below to schedule a visit and establish care. When you choose a [facility] primary care physician or advanced practice provider, you gain a long-term partner in health. Find a Primary Care Provider  Learn more about [facility] in-office and virtual care options: [facility] -  Get Care Now  "

## 2024-02-06 NOTE — Progress Notes (Signed)
 " Virtual Visit Consent   Ashley Petersen, you are scheduled for a virtual visit with a [facility] provider today. Just as with appointments in the office, your consent must be obtained to participate. Your consent will be active for this visit and any virtual visit you may have with one of our providers in the next 365 days. If you have a MyChart account, a copy of this consent can be sent to you electronically.  As this is a virtual visit, video technology does not allow for your provider to perform a traditional examination. This may limit your provider's ability to fully assess your condition. If your provider identifies any concerns that need to be evaluated in person or the need to arrange testing (such as labs, EKG, etc.), we will make arrangements to do so. Although advances in technology are sophisticated, we cannot ensure that it will always work on either your end or our end. If the connection with a video visit is poor, the visit may have to be switched to a telephone visit. With either a video or telephone visit, we are not always able to ensure that we have a secure connection.  By engaging in this virtual visit, you consent to the provision of healthcare and authorize for your insurance to be billed (if applicable) for the services provided during this visit. Depending on your insurance coverage, you may receive a charge related to this service.  I need to obtain your verbal consent now. Are you willing to proceed with your visit today? Alizandra Axe has provided verbal consent on 02/06/2024 for a virtual visit (video or telephone). Jon CHRISTELLA Belt, NP  Date: 02/06/2024 4:33 PM   Virtual Visit via Video Note   I, Jon CHRISTELLA Belt, connected with  Hadas Jessop  ([id number], 07/06/2000) on 02/06/24 at  3:45 PM EST by a video-enabled telemedicine application and verified that I am speaking with the correct person using two identifiers.  Location: Patient: Virtual Visit Location Patient:  Home Provider: Virtual Visit Location Provider: Home Office   I discussed the limitations of evaluation and management by telemedicine and the availability of in person appointments. The patient expressed understanding and agreed to proceed.    History of Present Illness: Ashley Petersen is a 24 y.o. who identifies as a female who was assigned female at birth, and is being seen today for tingling and sharp pains in the tips of her fingers on and off. Started today. Yesterday dx with flu, started tamiflu and benzonatate . Also Taking ibuprofen  and tylenol .   Sx are in fingertips of both hands.   Other medicine includes methimazole .   Does not feel sob. Has an albuterol  inhaler she can use if needed.   Also takes iron for anemia. Prior labs show hgb 10.5 09/19/23.   No recent neck or arm injuries. Has never had this happen before.    HPI: HPI  Problems:  Patient Active Problem List   Diagnosis Date Noted   Tonsillitis 04/07/2022   Bacterial vaginitis 01/01/2021   Candida vaginitis 01/01/2021   Asthma 12/30/2020   H/O renal calculi 12/18/2013    Allergies: Allergies[1] Medications: Current Medications[2]  Observations/Objective: Patient is well-developed, well-nourished in no acute distress.  Resting comfortably  at home.  Head is normocephalic, atraumatic.  No labored breathing.  Speech is clear and coherent with logical content.  Patient is alert and oriented at baseline.    Assessment and Plan: 1. Tingling in extremities (Primary)  I am not sure of cause  of sx. Methimazole  can cause paraesthesias.  Some anemias can, too. I don't see an anemia work up for her. I recommended she contact her endo about methimazole  - could it be causing sx? I rec she be checked in person if sx continue or worsen. Will need more work up than I can do over video.   Follow Up Instructions: I discussed the assessment and treatment plan with the patient. The patient was provided an opportunity to ask  questions and all were answered. The patient agreed with the plan and demonstrated an understanding of the instructions.  A copy of instructions were sent to the patient via MyChart unless otherwise noted below.   The patient was advised to call back or seek an in-person evaluation if the symptoms worsen or if the condition fails to improve as anticipated.    Jon CHRISTELLA Belt, NP    [1]  Allergies Allergen Reactions   Pecan Nut (Diagnostic) Hives, Itching, Shortness Of Breath and Swelling   Tree Extract Anaphylaxis    From an allergy test From an allergy test    Peanut-Containing Drug Products Swelling and Other (See Comments)  [2]  Current Outpatient Medications:    albuterol  (VENTOLIN  HFA) 108 (90 Base) MCG/ACT inhaler, Inhale 1-2 puffs into the lungs every 6 (six) hours as needed for wheezing or shortness of breath., Disp: 18 g, Rfl: 1   atorvastatin  (LIPITOR) 20 MG tablet, Take 1 tablet (20 mg total) by mouth daily., Disp: 90 tablet, Rfl: 0   beclomethasone (QVAR ) 40 MCG/ACT inhaler, Inhale 2 puffs into the lungs 2 (two) times daily., Disp: 1 each, Rfl: 2   benzonatate  (TESSALON ) 100 MG capsule, Take 1 capsule (100 mg total) by mouth every 8 (eight) hours., Disp: 21 capsule, Rfl: 0   EPINEPHrine  (EPIPEN  2-PAK) 0.3 mg/0.3 mL IJ SOAJ injection, Inject 0.3 mg into the muscle as needed for anaphylaxis., Disp: 2 each, Rfl: 1   methimazole  (TAPAZOLE ) 5 MG tablet, TAKE 1/2 A TABLET BY MOUTH MONDAY THROUGH SATURDAY. NONE ON SUNDAYS, Disp: 33 tablet, Rfl: 2   naproxen  (NAPROSYN ) 500 MG tablet, Take 1 tablet (500 mg total) by mouth daily as needed., Disp: 90 tablet, Rfl: 1   predniSONE  (DELTASONE ) 20 MG tablet, Take 2 tablets (40 mg total) by mouth daily for 5 days. Take with breakfast or lunch. Avoid NSAIDs (ibuprofen , etc) while taking this medication., Disp: 10 tablet, Rfl: 0   promethazine -dextromethorphan (PROMETHAZINE -DM) 6.25-15 MG/5ML syrup, Take 5 mLs by mouth 4 (four) times daily as  needed for cough., Disp: 118 mL, Rfl: 0  "

## 2024-02-09 NOTE — Telephone Encounter (Signed)
 Report to the Emergency Department/Urgent Care/call 911 for immediate medical evaluation. Follow-up with Primary Care.

## 2024-06-07 ENCOUNTER — Ambulatory Visit: Admitting: Internal Medicine
